# Patient Record
Sex: Male | Born: 1945
Health system: Southern US, Community
[De-identification: ages and names within clinical notes are randomized; demographics above are authoritative.]

## PROBLEM LIST (undated history)

## (undated) DIAGNOSIS — E785 Hyperlipidemia, unspecified: Secondary | ICD-10-CM

## (undated) DIAGNOSIS — E119 Type 2 diabetes mellitus without complications: Secondary | ICD-10-CM

## (undated) DIAGNOSIS — Z952 Presence of prosthetic heart valve: Secondary | ICD-10-CM

## (undated) DIAGNOSIS — I10 Essential (primary) hypertension: Secondary | ICD-10-CM

## (undated) DIAGNOSIS — M109 Gout, unspecified: Secondary | ICD-10-CM

## (undated) HISTORY — DX: Gout, unspecified: M10.9

## (undated) HISTORY — DX: Hyperlipidemia, unspecified: E78.5

## (undated) HISTORY — DX: Type 2 diabetes mellitus without complications: E11.9

## (undated) HISTORY — DX: Essential (primary) hypertension: I10

## (undated) HISTORY — DX: Presence of prosthetic heart valve: Z95.2

---

## 2004-10-14 HISTORY — PX: FOOT SURGERY: SHX648

## 2009-10-14 HISTORY — PX: AORTIC VALVE REPLACEMENT: SHX41

## 2009-10-14 HISTORY — PX: EYE SURGERY: SHX253

## 2016-04-18 DIAGNOSIS — E1165 Type 2 diabetes mellitus with hyperglycemia: Secondary | ICD-10-CM | POA: Diagnosis not present

## 2016-04-18 DIAGNOSIS — Z6823 Body mass index (BMI) 23.0-23.9, adult: Secondary | ICD-10-CM | POA: Diagnosis not present

## 2016-04-18 DIAGNOSIS — Z952 Presence of prosthetic heart valve: Secondary | ICD-10-CM | POA: Diagnosis not present

## 2016-04-18 DIAGNOSIS — M109 Gout, unspecified: Secondary | ICD-10-CM | POA: Diagnosis not present

## 2016-04-18 DIAGNOSIS — I10 Essential (primary) hypertension: Secondary | ICD-10-CM | POA: Diagnosis not present

## 2016-05-06 DIAGNOSIS — E1165 Type 2 diabetes mellitus with hyperglycemia: Secondary | ICD-10-CM | POA: Diagnosis not present

## 2016-05-06 DIAGNOSIS — Z6823 Body mass index (BMI) 23.0-23.9, adult: Secondary | ICD-10-CM | POA: Diagnosis not present

## 2016-05-06 DIAGNOSIS — I10 Essential (primary) hypertension: Secondary | ICD-10-CM | POA: Diagnosis not present

## 2016-05-06 DIAGNOSIS — Z952 Presence of prosthetic heart valve: Secondary | ICD-10-CM | POA: Diagnosis not present

## 2016-05-06 DIAGNOSIS — M109 Gout, unspecified: Secondary | ICD-10-CM | POA: Diagnosis not present

## 2016-05-06 DIAGNOSIS — Z7689 Persons encountering health services in other specified circumstances: Secondary | ICD-10-CM | POA: Diagnosis not present

## 2016-06-24 DIAGNOSIS — I10 Essential (primary) hypertension: Secondary | ICD-10-CM | POA: Diagnosis not present

## 2016-06-24 DIAGNOSIS — M109 Gout, unspecified: Secondary | ICD-10-CM | POA: Diagnosis not present

## 2016-06-24 DIAGNOSIS — E1165 Type 2 diabetes mellitus with hyperglycemia: Secondary | ICD-10-CM | POA: Diagnosis not present

## 2016-06-24 DIAGNOSIS — Z952 Presence of prosthetic heart valve: Secondary | ICD-10-CM | POA: Diagnosis not present

## 2016-09-20 DIAGNOSIS — I10 Essential (primary) hypertension: Secondary | ICD-10-CM | POA: Diagnosis not present

## 2016-09-20 DIAGNOSIS — Z952 Presence of prosthetic heart valve: Secondary | ICD-10-CM | POA: Diagnosis not present

## 2016-09-20 DIAGNOSIS — M109 Gout, unspecified: Secondary | ICD-10-CM | POA: Diagnosis not present

## 2016-09-20 DIAGNOSIS — E1165 Type 2 diabetes mellitus with hyperglycemia: Secondary | ICD-10-CM | POA: Diagnosis not present

## 2016-09-24 DIAGNOSIS — I10 Essential (primary) hypertension: Secondary | ICD-10-CM | POA: Diagnosis not present

## 2016-09-24 DIAGNOSIS — M109 Gout, unspecified: Secondary | ICD-10-CM | POA: Diagnosis not present

## 2016-09-24 DIAGNOSIS — Z952 Presence of prosthetic heart valve: Secondary | ICD-10-CM | POA: Diagnosis not present

## 2016-09-24 DIAGNOSIS — Z23 Encounter for immunization: Secondary | ICD-10-CM | POA: Diagnosis not present

## 2016-09-24 DIAGNOSIS — E1165 Type 2 diabetes mellitus with hyperglycemia: Secondary | ICD-10-CM | POA: Diagnosis not present

## 2016-09-24 DIAGNOSIS — E875 Hyperkalemia: Secondary | ICD-10-CM | POA: Diagnosis not present

## 2017-05-14 DIAGNOSIS — Z1212 Encounter for screening for malignant neoplasm of rectum: Secondary | ICD-10-CM | POA: Diagnosis not present

## 2017-05-14 DIAGNOSIS — E1165 Type 2 diabetes mellitus with hyperglycemia: Secondary | ICD-10-CM | POA: Diagnosis not present

## 2017-05-14 DIAGNOSIS — M109 Gout, unspecified: Secondary | ICD-10-CM | POA: Diagnosis not present

## 2017-05-14 DIAGNOSIS — Z0001 Encounter for general adult medical examination with abnormal findings: Secondary | ICD-10-CM | POA: Diagnosis not present

## 2017-05-14 DIAGNOSIS — Z952 Presence of prosthetic heart valve: Secondary | ICD-10-CM | POA: Diagnosis not present

## 2017-05-14 DIAGNOSIS — I10 Essential (primary) hypertension: Secondary | ICD-10-CM | POA: Diagnosis not present

## 2017-05-15 DIAGNOSIS — E875 Hyperkalemia: Secondary | ICD-10-CM | POA: Diagnosis not present

## 2017-05-22 DIAGNOSIS — E875 Hyperkalemia: Secondary | ICD-10-CM | POA: Diagnosis not present

## 2017-05-30 DIAGNOSIS — E875 Hyperkalemia: Secondary | ICD-10-CM | POA: Diagnosis not present

## 2017-06-02 DIAGNOSIS — E875 Hyperkalemia: Secondary | ICD-10-CM | POA: Diagnosis not present

## 2017-10-22 DIAGNOSIS — E875 Hyperkalemia: Secondary | ICD-10-CM | POA: Diagnosis not present

## 2017-10-22 DIAGNOSIS — Z952 Presence of prosthetic heart valve: Secondary | ICD-10-CM | POA: Diagnosis not present

## 2017-10-22 DIAGNOSIS — I10 Essential (primary) hypertension: Secondary | ICD-10-CM | POA: Diagnosis not present

## 2017-10-22 DIAGNOSIS — E1165 Type 2 diabetes mellitus with hyperglycemia: Secondary | ICD-10-CM | POA: Diagnosis not present

## 2017-10-28 DIAGNOSIS — E1165 Type 2 diabetes mellitus with hyperglycemia: Secondary | ICD-10-CM | POA: Diagnosis not present

## 2017-10-28 DIAGNOSIS — I10 Essential (primary) hypertension: Secondary | ICD-10-CM | POA: Diagnosis not present

## 2017-10-28 DIAGNOSIS — M545 Low back pain: Secondary | ICD-10-CM | POA: Diagnosis not present

## 2017-10-28 DIAGNOSIS — M109 Gout, unspecified: Secondary | ICD-10-CM | POA: Diagnosis not present

## 2017-10-28 DIAGNOSIS — Z23 Encounter for immunization: Secondary | ICD-10-CM | POA: Diagnosis not present

## 2017-10-28 DIAGNOSIS — Z952 Presence of prosthetic heart valve: Secondary | ICD-10-CM | POA: Diagnosis not present

## 2018-10-21 DIAGNOSIS — M545 Low back pain: Secondary | ICD-10-CM | POA: Diagnosis not present

## 2018-10-21 DIAGNOSIS — Z952 Presence of prosthetic heart valve: Secondary | ICD-10-CM | POA: Diagnosis not present

## 2018-10-21 DIAGNOSIS — Z23 Encounter for immunization: Secondary | ICD-10-CM | POA: Diagnosis not present

## 2018-10-21 DIAGNOSIS — M109 Gout, unspecified: Secondary | ICD-10-CM | POA: Diagnosis not present

## 2018-10-21 DIAGNOSIS — R0609 Other forms of dyspnea: Secondary | ICD-10-CM | POA: Diagnosis not present

## 2018-10-21 DIAGNOSIS — E1165 Type 2 diabetes mellitus with hyperglycemia: Secondary | ICD-10-CM | POA: Diagnosis not present

## 2018-10-21 DIAGNOSIS — I10 Essential (primary) hypertension: Secondary | ICD-10-CM | POA: Diagnosis not present

## 2018-11-20 ENCOUNTER — Encounter: Payer: Self-pay | Admitting: *Deleted

## 2018-11-23 ENCOUNTER — Encounter: Payer: Self-pay | Admitting: *Deleted

## 2018-11-23 ENCOUNTER — Telehealth: Payer: Self-pay | Admitting: Cardiovascular Disease

## 2018-11-23 ENCOUNTER — Ambulatory Visit (INDEPENDENT_AMBULATORY_CARE_PROVIDER_SITE_OTHER): Payer: Medicare Other | Admitting: Cardiovascular Disease

## 2018-11-23 ENCOUNTER — Encounter: Payer: Self-pay | Admitting: Cardiovascular Disease

## 2018-11-23 VITALS — BP 211/85 | HR 46 | Ht 66.5 in | Wt 165.8 lb

## 2018-11-23 DIAGNOSIS — I10 Essential (primary) hypertension: Secondary | ICD-10-CM | POA: Diagnosis not present

## 2018-11-23 DIAGNOSIS — Z952 Presence of prosthetic heart valve: Secondary | ICD-10-CM | POA: Diagnosis not present

## 2018-11-23 DIAGNOSIS — N183 Chronic kidney disease, stage 3 unspecified: Secondary | ICD-10-CM

## 2018-11-23 DIAGNOSIS — E1165 Type 2 diabetes mellitus with hyperglycemia: Secondary | ICD-10-CM

## 2018-11-23 DIAGNOSIS — R0609 Other forms of dyspnea: Secondary | ICD-10-CM

## 2018-11-23 DIAGNOSIS — Z951 Presence of aortocoronary bypass graft: Secondary | ICD-10-CM | POA: Diagnosis not present

## 2018-11-23 DIAGNOSIS — R001 Bradycardia, unspecified: Secondary | ICD-10-CM

## 2018-11-23 MED ORDER — METOPROLOL TARTRATE 25 MG PO TABS: 25.0000 mg | ORAL_TABLET | Freq: Two times a day (BID) | ORAL | 6 refills | Status: DC

## 2018-11-23 NOTE — Patient Instructions (Signed)
Medication Instructions:   Decrease Lopressor to 25mg  twice a day.  Continue all other medications.    Labwork:   Testing/Procedures:  Your physician has requested that you have an echocardiogram. Echocardiography is a painless test that uses sound waves to create images of your heart. It provides your doctor with information about the size and shape of your heart and how well your heart's chambers and valves are working. This procedure takes approximately one hour. There are no restrictions for this procedure.  Your physician has requested that you have en exercise stress myoview. For further information please visit [email or web address]. Please follow instruction sheet, as given.  Office will contact with results via phone or letter.    Follow-Up: 3 months   Any Other Special Instructions Will Be Listed Below (If Applicable). Your physician has requested that you regularly monitor and record your blood pressure readings at home. Please take readings 3 x week for 1 months and return log to office for MD review.    If you need a refill on your cardiac medications before your next appointment, please call your pharmacy.

## 2018-11-23 NOTE — Progress Notes (Signed)
CARDIOLOGY CONSULT NOTE  Patient ID: Keith Parrish MRN: [id number] DOB/AGE: 02/24/1946 73 y.o.  Admit date: (Not on file) Primary Physician: Rory Percy, MD Referring Physician: Rory Percy, MD  Reason for Consultation: Dyspnea on exertion  HPI: Keith Parrish is a 73 y.o. male who is being seen today for the evaluation of dyspnea on exertion at the request of Rory Percy, MD.   Past medical history includes hypertension, diabetes, gout, chronic kidney disease stage III, history of bioprosthetic aortic valve replacement, and possibly one-vessel CABG.  I reviewed labs dated 10/21/2018: BUN 40, creatinine 1.63, sodium 141, potassium 5.2, hemoglobin 13, total cholesterol 216, triglycerides 141, HDL 35, LDL 153, A1c 9.1%, TSH 2.13.  ECG performed in the office today which I ordered and personally interpreted demonstrates marked sinus bradycardia with left anterior fascicular block and nonspecific T wave abnormalities.  He and his wife are here today.  They moved from [place] initially to [place] and then to [place] about 3 years ago.  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  His wife has several relatives who used to teach skiing at [employer] in [place].  He underwent bioprosthetic Edwards aortic valve replacement on 12/28/2009 at [facility].  He may have also undergone one-vessel CABG but that detail is unclear to me.  Since the summer 2019 he has been experiencing progressive exertional dyspnea.  He is now short of breath when taking the trash cans out to the end of the driveway.  His wife said his breathing was labored last night.  He denies exertional chest pain.  He denies orthopnea, palpitations, and leg swelling.  He checks his blood pressure at home and it is usually well controlled.  It is markedly elevated today, 211/85, and his wife said he is very nervous today.    Soc Hx: They moved from [place] initially to  [place] and then to [place] about 3 years ago.  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  His wife has several relatives who used to teach skiing at [employer] in [place].   Allergies  Allergen Reactions  . Bee Venom Shortness Of Breath and Swelling  . Ace Inhibitors Other (See Comments)    Elevated potassium    Current Outpatient Medications  Medication Sig Dispense Refill  . allopurinol (ZYLOPRIM) 100 MG tablet Take 100 mg by mouth daily.     . APPLE CIDER VINEGAR PO Take 15 mLs by mouth daily.    . Insulin Glargine (LANTUS SOLOSTAR) 100 UNIT/ML Solostar Pen Inject 50 Units into the skin daily.    . metoprolol tartrate (LOPRESSOR) 50 MG tablet Take 50 mg by mouth 2 (two) times daily.     No current facility-administered medications for this visit.     Past Medical History:  Diagnosis Date  . Diabetes ([place])   . Gout   . Hyperlipidemia   . Hypertension   . Presence of prosthetic heart valve     Past Surgical History:  Procedure Laterality Date  . AORTIC VALVE REPLACEMENT  2011   pig valve  . EYE SURGERY Bilateral 2011   cataract removed from right and left eye  . FOOT SURGERY Left 2006   gout cyst removed    Social History   Socioeconomic History  . Marital status: Married    Spouse name: Not on file  . Number of children: Not on file  . Years of education: Not on  file  . Highest education level: Not on file  Occupational History  . Not on file  Social Needs  . Financial resource strain: Not on file  . Food insecurity:    Worry: Not on file    Inability: Not on file  . Transportation needs:    Medical: Not on file    Non-medical: Not on file  Tobacco Use  . Smoking status: Former Smoker    Types: Pipe  . Smokeless tobacco: Never Used  Substance and Sexual Activity  . Alcohol use: Not on file  . Drug use: Not on file  . Sexual activity: Not on file  Lifestyle  . Physical activity:    Days per week: Not on file    Minutes per  session: Not on file  . Stress: Not on file  Relationships  . Social connections:    Talks on phone: Not on file    Gets together: Not on file    Attends religious service: Not on file    Active member of club or organization: Not on file    Attends meetings of clubs or organizations: Not on file    Relationship status: Not on file  . Intimate partner violence:    Fear of current or ex partner: Not on file    Emotionally abused: Not on file    Physically abused: Not on file    Forced sexual activity: Not on file  Other Topics Concern  . Not on file  Social History Narrative  . Not on file     No family history of premature CAD in 1st degree relatives.  Current Meds  Medication Sig  . allopurinol (ZYLOPRIM) 100 MG tablet Take 100 mg by mouth daily.   . APPLE CIDER VINEGAR PO Take 15 mLs by mouth daily.  . Insulin Glargine (LANTUS SOLOSTAR) 100 UNIT/ML Solostar Pen Inject 50 Units into the skin daily.  . metoprolol tartrate (LOPRESSOR) 50 MG tablet Take 50 mg by mouth 2 (two) times daily.      Review of systems complete and found to be negative unless listed above in HPI    Physical exam Blood pressure (!) 211/85, pulse (!) 46, height 5' 6.5" (1.689 m), weight 165 lb 12.8 oz (75.2 kg), SpO2 97 %. General: NAD Neck: No JVD, no thyromegaly or thyroid nodule.  Lungs: Clear to auscultation bilaterally with normal respiratory effort. CV: Nondisplaced PMI.  Bradycardic, regular rhythm, normal S1/S2, no S3/S4, no murmur.  No peripheral edema.  No carotid bruit.    Abdomen: Soft, nontender, no distention.  Skin: Intact without lesions or rashes.  Neurologic: Alert and oriented x 3.  Psych: Normal affect. Extremities: No clubbing or cyanosis.  HEENT: Normal.   ECG: Most recent ECG reviewed.   Labs: No results found for: K, BUN, CREATININE, ALT, TSH, HGB   Lipids: No results found for: LDLCALC, LDLDIRECT, CHOL, TRIG, HDL      ASSESSMENT AND PLAN:  1.  Dyspnea on  exertion: Multiple cardiovascular risk factors as detailed above.  He is also markedly bradycardic and this may also be contributing to his symptoms.  I will reduce metoprolol to 25 mg twice daily.  I will obtain an exercise Myoview to evaluate for ischemia.  He may have a history of one-vessel CABG.  I will obtain cardiac catheterization and operative reports from [facility].  2.  Accelerated hypertension: He is markedly bradycardic so I will reduce metoprolol to 25 mg twice daily.  I will have him  check his blood pressure and heart rate at home 3 times per week for the next month to see if further medication adjustments are required.  3.  Chronic kidney disease stage III: Labs reviewed above.  He needs aggressive control of blood pressure and diabetes.  4.  Type 2 diabetes mellitus: Currently on Lantus.  He meets criteria for statin therapy.  He also may have a history of one-vessel CABG.  I will address this at his next visit.  A1c greater than 9%.  5.  Bradycardia: I will use metoprolol to 25 mg twice daily.  6.  Status post bioprosthetic aortic valve replacement: No murmurs appreciated on exam. I will order a 2-D echocardiogram with Doppler to evaluate cardiac structure, function, and regional wall motion.   Disposition: Follow up in 3 months   Signed: Kate Sable, M.D., F.A.C.C.  11/23/2018, 9:41 AM

## 2018-11-23 NOTE — Telephone Encounter (Signed)
°  Precert needed for: Echo - h/o AVR   Location: [facility]     Date:  Dec 09, 2018

## 2018-11-23 NOTE — Telephone Encounter (Signed)
°  Precert needed for:    *Exercise MV - doe    Location: [facility]    Date: Dec 11, 2018

## 2018-11-25 ENCOUNTER — Other Ambulatory Visit: Payer: Self-pay | Admitting: Cardiovascular Disease

## 2018-11-25 DIAGNOSIS — I359 Nonrheumatic aortic valve disorder, unspecified: Secondary | ICD-10-CM

## 2018-12-09 ENCOUNTER — Ambulatory Visit (INDEPENDENT_AMBULATORY_CARE_PROVIDER_SITE_OTHER): Payer: Medicare Other

## 2018-12-09 ENCOUNTER — Telehealth: Payer: Self-pay | Admitting: Cardiovascular Disease

## 2018-12-09 DIAGNOSIS — I359 Nonrheumatic aortic valve disorder, unspecified: Secondary | ICD-10-CM

## 2018-12-09 NOTE — Telephone Encounter (Signed)
Can you call and ask him why? His symptoms were concerning.

## 2018-12-09 NOTE — Telephone Encounter (Signed)
Noted and will forward to nurse for f/u

## 2018-12-09 NOTE — Telephone Encounter (Signed)
Patient does not want to due Stress test and has cancelled them.

## 2018-12-09 NOTE — Telephone Encounter (Signed)
Wireless customer not available

## 2018-12-09 NOTE — Telephone Encounter (Signed)
Forward to provider for notification.  Do you want to keep his f/u in 3 months ??

## 2018-12-11 ENCOUNTER — Encounter (HOSPITAL_COMMUNITY): Payer: Medicare Other

## 2018-12-11 ENCOUNTER — Ambulatory Visit (HOSPITAL_COMMUNITY): Payer: Medicare Other

## 2018-12-11 NOTE — Telephone Encounter (Signed)
Notes recorded by Laurine Blazer, LPN on 05/12/8568 at 4:37 PM EST Patient notified. Copy to pmd. Follow up scheduled for 03/09/19 with Dr. Paralee Cancel office.   ------  Notes recorded by Laurine Blazer, LPN on 0/02/2590 at 0:28 PM EST Wireless customer not available. ------  Notes recorded by Herminio Commons, MD on 12/09/2018 at 12:58 PM EST Pumping function of main cardiac chamber (left ventricle) was normal. There is severe thickening of the muscular walls. The upper chambers of the heart are dilated. The artificial aortic valve appears to be functioning well. There is some mild to moderate mitral valve leakage. There is moderate tricuspid valve leakage. Pulmonary pressures are severely elevated. He reportedly canceled his stress test. He may need right and left heart catheterization for a more comprehensive evaluation.

## 2018-12-11 NOTE — Telephone Encounter (Signed)
Patient cancelled the stress due to fear of something happening during stress test.  Had other family members that has had chemical stress test and had problems during the test.  I explained to him the difference between the Exercise Myoview vs the [facility].  Patient stated that is willing to do the test now that he has better understanding of how they are done.  States he will do whichever test that his doctor feels he needs.

## 2018-12-14 NOTE — Telephone Encounter (Signed)
Actually think the best strategy is to pursue right and left heart catheterization and coronary angiography given the markedly elevated pressures in his lungs based on echocardiogram.  I have to determine if his symptoms are due to blockages in his arteries, significantly elevated pressures in his lungs, or both.  He can be scheduled to see an APP in near future for further discussion of this or we can just go ahead and schedule this.

## 2018-12-16 NOTE — Telephone Encounter (Signed)
Left message to return call on Friday as I will be out of the office tomorrow.

## 2018-12-18 NOTE — Telephone Encounter (Signed)
Left message to return call 

## 2018-12-23 NOTE — Telephone Encounter (Signed)
Left message to return call.  (3rd attempt)

## 2018-12-29 ENCOUNTER — Encounter: Payer: Self-pay | Admitting: *Deleted

## 2018-12-29 NOTE — Telephone Encounter (Signed)
No recall to present date.  Will mail letter.

## 2019-03-03 ENCOUNTER — Telehealth: Payer: Self-pay | Admitting: *Deleted

## 2019-03-03 NOTE — Telephone Encounter (Signed)
Called to offer VV but patient declined and denied any worsening symptoms of chest pain, dizziness, sob, palpitations, weight gain or swelling. Appointment rescheduled per patient request.

## 2019-03-09 ENCOUNTER — Ambulatory Visit: Payer: Medicare Other | Admitting: Cardiovascular Disease

## 2019-05-25 DIAGNOSIS — E1165 Type 2 diabetes mellitus with hyperglycemia: Secondary | ICD-10-CM | POA: Diagnosis not present

## 2019-05-25 DIAGNOSIS — E162 Hypoglycemia, unspecified: Secondary | ICD-10-CM | POA: Diagnosis not present

## 2019-05-25 DIAGNOSIS — E161 Other hypoglycemia: Secondary | ICD-10-CM | POA: Diagnosis not present

## 2019-05-25 DIAGNOSIS — Z952 Presence of prosthetic heart valve: Secondary | ICD-10-CM | POA: Diagnosis not present

## 2019-05-25 DIAGNOSIS — R410 Disorientation, unspecified: Secondary | ICD-10-CM | POA: Diagnosis not present

## 2019-05-25 DIAGNOSIS — R4020 Unspecified coma: Secondary | ICD-10-CM | POA: Diagnosis not present

## 2019-05-25 DIAGNOSIS — R404 Transient alteration of awareness: Secondary | ICD-10-CM | POA: Diagnosis not present

## 2019-05-25 DIAGNOSIS — R0600 Dyspnea, unspecified: Secondary | ICD-10-CM | POA: Diagnosis not present

## 2019-05-25 DIAGNOSIS — R339 Retention of urine, unspecified: Secondary | ICD-10-CM | POA: Diagnosis not present

## 2019-05-25 DIAGNOSIS — R609 Edema, unspecified: Secondary | ICD-10-CM | POA: Diagnosis not present

## 2019-06-01 ENCOUNTER — Telehealth: Payer: Self-pay | Admitting: Cardiovascular Disease

## 2019-06-01 NOTE — Telephone Encounter (Signed)

## 2019-06-03 ENCOUNTER — Other Ambulatory Visit: Payer: Self-pay

## 2019-06-03 ENCOUNTER — Encounter: Payer: Self-pay | Admitting: Cardiovascular Disease

## 2019-06-03 ENCOUNTER — Ambulatory Visit: Payer: Medicare Other | Admitting: Cardiovascular Disease

## 2019-06-03 ENCOUNTER — Encounter: Payer: Self-pay | Admitting: *Deleted

## 2019-06-03 VITALS — BP 215/89 | HR 72 | Ht 66.5 in | Wt 173.6 lb

## 2019-06-03 DIAGNOSIS — Z951 Presence of aortocoronary bypass graft: Secondary | ICD-10-CM

## 2019-06-03 DIAGNOSIS — I10 Essential (primary) hypertension: Secondary | ICD-10-CM | POA: Diagnosis not present

## 2019-06-03 DIAGNOSIS — N183 Chronic kidney disease, stage 3 unspecified: Secondary | ICD-10-CM

## 2019-06-03 DIAGNOSIS — I160 Hypertensive urgency: Secondary | ICD-10-CM

## 2019-06-03 DIAGNOSIS — Z952 Presence of prosthetic heart valve: Secondary | ICD-10-CM

## 2019-06-03 DIAGNOSIS — R0609 Other forms of dyspnea: Secondary | ICD-10-CM

## 2019-06-03 DIAGNOSIS — E1165 Type 2 diabetes mellitus with hyperglycemia: Secondary | ICD-10-CM

## 2019-06-03 DIAGNOSIS — I2721 Secondary pulmonary arterial hypertension: Secondary | ICD-10-CM | POA: Diagnosis not present

## 2019-06-03 DIAGNOSIS — I5033 Acute on chronic diastolic (congestive) heart failure: Secondary | ICD-10-CM

## 2019-06-03 MED ORDER — CARVEDILOL 3.125 MG PO TABS: 3.1250 mg | ORAL_TABLET | Freq: Two times a day (BID) | ORAL | 1 refills | Status: DC

## 2019-06-03 MED ORDER — FUROSEMIDE 40 MG PO TABS: ORAL_TABLET | ORAL | 1 refills | Status: DC

## 2019-06-03 MED ORDER — AMLODIPINE BESYLATE 5 MG PO TABS: 5.0000 mg | ORAL_TABLET | Freq: Every day | ORAL | 1 refills | Status: DC

## 2019-06-03 NOTE — Patient Instructions (Signed)
Your physician recommends that you schedule a follow-up appointment in: [place] has recommended you make the following change in your medication:   STOP LOPRESSOR   START COREG 3.125 MG TWICE DAILY   START LASIX 40 MG TWICE DAILY FOR 5 DAYS THEN TAKE 1 TABLET DAILY   Your physician recommends that you return for lab work ON Saturday BMP AND BMP ON NEXT Friday 06/11/2019 - WE HAVE GIVEN YOU ORDERS   PLEASE CALL us ON Monday WITH AN UPDATE ON YOUR SYMPTOMS   Thank you for choosing [facility]!!

## 2019-06-03 NOTE — Progress Notes (Signed)
SUBJECTIVE: The patient presents for follow-up after initially evaluated him on 11/23/2018.  At that time he was complaining of exertional dyspnea.  I ordered a stress test which the patient canceled.  I also ordered an echocardiogram.  Echocardiogram on 12/09/2018 demonstrated normal LV systolic function, EF 55 to 60%, severe LVH, diastolic dysfunction, mild right ventricular enlargement, severe left atrial and moderate right atrial dilatation, trivial pericardial effusion, normally functioning bioprosthetic aortic valve, mild to moderate mitral regurgitation, moderate tricuspid regurgitation, and severe pulmonary hypertension.  Past medical history includes hypertension, diabetes, gout, chronic kidney disease stage III, history of bioprosthetic aortic valve replacement, and possibly one-vessel CABG.  I did not receive records from [facility].  He underwent bioprosthetic Edwards aortic valve replacement on 12/28/2009 at [facility].  He may have also undergone one-vessel CABG but that detail is unclear to me.  He is here with his wife.  Over the past 3 weeks he has developed progressive swelling from his waist down.  He complains of scrotal and bilateral leg swelling.  His baseline weight is between 155 and 160 pounds and he weighs 173 pounds in our office today.  He said his blood pressure is markedly elevated today because he is very nervous.  It is 215/89.  He denies chest pain.  Chronic exertional dyspnea is stable since I last saw him.  His blood sugar dropped to 38 about 1 or 2 weeks ago and EMS was called.  They gave him dextrose and he improved and he was not taken to the hospital.  His PCP recently prescribed Lasix 20 mg daily for 7 days.  He said he urinated more for the first 24 hours but after that it subsided.    Soc Hx: They moved from [place] initially to [place] and then to [place] about 3 years ago.  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  His wife has several relatives who used to teach skiing at [employer] in [place].  Review of Systems: As per "subjective", otherwise negative.  Allergies  Allergen Reactions   Bee Venom Shortness Of Breath and Swelling   Ace Inhibitors Other (See Comments)    Elevated potassium    Current Outpatient Medications  Medication Sig Dispense Refill   allopurinol (ZYLOPRIM) 100 MG tablet Take 100 mg by mouth daily.      APPLE CIDER VINEGAR PO Take 15 mLs by mouth daily.     Insulin Glargine (LANTUS SOLOSTAR) 100 UNIT/ML Solostar Pen Inject 50 Units into the skin daily.     metoprolol tartrate (LOPRESSOR) 25 MG tablet Take 1 tablet (25 mg total) by mouth 2 (two) times daily. 60 tablet 6   No current facility-administered medications for this visit.     Past Medical History:  Diagnosis Date   Diabetes ([place])    Gout    Hyperlipidemia    Hypertension    Presence of prosthetic heart valve     Past Surgical History:  Procedure Laterality Date   AORTIC VALVE REPLACEMENT  2011   pig valve   EYE SURGERY Bilateral 2011   cataract removed from right and left eye   FOOT SURGERY Left 2006   gout cyst removed    Social History   Socioeconomic History   Marital status: Married    Spouse name: Not on file   Number of children: Not on file   Years of education: Not on file   Highest education level: Not on  file  Occupational History   Not on file  Social Needs   Financial resource strain: Not on file   Food insecurity    Worry: Not on file    Inability: Not on file   Transportation needs    Medical: Not on file    Non-medical: Not on file  Tobacco Use   Smoking status: Former Smoker    Types: Pipe   Smokeless tobacco: Never Used  Substance and Sexual Activity   Alcohol use: Not on file   Drug use: Not on file   Sexual activity: Not on file  Lifestyle   Physical activity    Days per week: Not on file    Minutes per session:  Not on file   Stress: Not on file  Relationships   Social connections    Talks on phone: Not on file    Gets together: Not on file    Attends religious service: Not on file    Active member of club or organization: Not on file    Attends meetings of clubs or organizations: Not on file    Relationship status: Not on file   Intimate partner violence    Fear of current or ex partner: Not on file    Emotionally abused: Not on file    Physically abused: Not on file    Forced sexual activity: Not on file  Other Topics Concern   Not on file  Social History Narrative   Not on file     Vitals:   06/03/19 1057 06/03/19 1100  BP: (!) 200/111 (!) 215/89  Pulse: 72   SpO2: 98%   Weight: 173 lb 9.6 oz (78.7 kg)   Height: 5' 6.5" (1.689 m)     Wt Readings from Last 3 Encounters:  06/03/19 173 lb 9.6 oz (78.7 kg)  11/23/18 165 lb 12.8 oz (75.2 kg)     PHYSICAL EXAM General: NAD HEENT: Normal. Neck: +JVD. Lungs: Clear to auscultation bilaterally with normal respiratory effort. CV: Regular rate and rhythm, normal S1/S2, no S3/S4, no murmur.  Tense bilateral leg edema.  No carotid bruit.   Abdomen: Soft, nontender, no distention.  Neurologic: Alert and oriented.  Psych: Normal affect. Skin: Normal. Musculoskeletal: No gross deformities.    ECG: Reviewed above under Subjective   Labs: No results found for: K, BUN, CREATININE, ALT, TSH, HGB   Lipids: No results found for: LDLCALC, LDLDIRECT, CHOL, TRIG, HDL     ASSESSMENT AND PLAN: 1.  Dyspnea on exertion/severe pulmonary hypertension/acute on chronic diastolic heart failure: He is at least 18 if not 23 pounds above his baseline weight.  I will start Lasix 40 mg twice daily for 5 days and then reduce to 40 mg daily.  I will check a basic metabolic panel in 2 days and then 1 week later.  I will have the results called to the cardiologist on-call at [facility].  I talked to him about hospitalization with IV diuretics and  daily monitoring of renal function but he declined.  I will switch Lopressor to carvedilol 3.125 mg twice daily.  I will add amlodipine 5 mg daily.  I will hold off on providing potassium supplementation until I see the results of his base metabolic panel given his chronic kidney disease.  I will have my nurse call him on Monday to see how he is doing. I suspect severe pulmonary hypertension is due to severely elevated blood pressures and consequent diastolic dysfunction.  He canceled the stress  test.   2.    Hypertensive urgency: Blood pressure is severely elevated.  I reviewed his blood pressure log and systolic readings are generally in the 170 range on average.  He has hypertensive heart disease with severe LVH.  I will start amlodipine 5 mg daily and switch Lopressor to carvedilol 3.125 mg twice daily.  3.  Chronic kidney disease stage III:  He needs aggressive control of blood pressure and diabetes.  Given diuretic adjustments as noted above, I will check a basic metabolic panel in 2 days and 1 week later. I will have the results called to the cardiologist on-call at [facility].   4.  Type 2 diabetes mellitus: Currently on Lantus.  He meets criteria for statin therapy.  He also may have a history of one-vessel CABG.  A1c greater than 9%.  5.  Status post bioprosthetic aortic valve replacement: Normally functioning bioprosthesis by echocardiogram earlier this year.    Disposition: Follow up 3 weeks  A high level of decision making was required for increased medical complexities.    Kate Sable, M.D., F.A.C.C.

## 2019-06-05 ENCOUNTER — Other Ambulatory Visit (HOSPITAL_COMMUNITY)
Admission: RE | Admit: 2019-06-05 | Discharge: 2019-06-05 | Disposition: A | Discharge status: Home / Self Care | Payer: Medicare Other | Source: Ambulatory Visit | Attending: Cardiovascular Disease | Admitting: Cardiovascular Disease

## 2019-06-05 DIAGNOSIS — R0609 Other forms of dyspnea: Secondary | ICD-10-CM | POA: Insufficient documentation

## 2019-06-05 LAB — BASIC METABOLIC PANEL
Anion gap: 8 (ref 5–15)
BUN: 35 mg/dL — ABNORMAL HIGH (ref 8–23)
CO2: 26 mmol/L (ref 22–32)
Calcium: 8.3 mg/dL — ABNORMAL LOW (ref 8.9–10.3)
Chloride: 106 mmol/L (ref 98–111)
Creatinine, Ser: 1.76 mg/dL — ABNORMAL HIGH (ref 0.61–1.24)
GFR calc Af Amer: 44 mL/min — ABNORMAL LOW (ref >60)
GFR calc non Af Amer: 38 mL/min — ABNORMAL LOW (ref >60)
Glucose, Bld: 181 mg/dL — ABNORMAL HIGH (ref 70–99)
Potassium: 4.8 mmol/L (ref 3.5–5.1)
Sodium: 140 mmol/L (ref 135–145)

## 2019-06-08 ENCOUNTER — Telehealth: Payer: Self-pay

## 2019-06-08 NOTE — Telephone Encounter (Signed)
Called pt. No answer. Left detailed message. Copy to pcp.

## 2019-06-10 ENCOUNTER — Telehealth: Payer: Self-pay | Admitting: Cardiovascular Disease

## 2019-06-10 NOTE — Telephone Encounter (Signed)
This is an [place] pt. She was confused about all of her appointments and lab orders. I let her know I will messge  [facility] schedulers to call her if they have any cancellations with Dr. Bronson Ing. Pt voiced understanding.

## 2019-06-10 NOTE — Telephone Encounter (Signed)
Patient called in regards to recent lab test results. Patient was under the impression she has an appointment on Monday 06-14-2019

## 2019-06-11 DIAGNOSIS — I10 Essential (primary) hypertension: Secondary | ICD-10-CM | POA: Diagnosis not present

## 2019-06-25 ENCOUNTER — Other Ambulatory Visit: Payer: Self-pay

## 2019-06-25 ENCOUNTER — Encounter: Payer: Self-pay | Admitting: Cardiovascular Disease

## 2019-06-25 ENCOUNTER — Ambulatory Visit: Payer: Medicare Other | Admitting: Cardiovascular Disease

## 2019-06-25 VITALS — BP 195/92 | HR 89 | Temp 98.0°F | Ht 67.0 in | Wt 141.0 lb

## 2019-06-25 DIAGNOSIS — I2721 Secondary pulmonary arterial hypertension: Secondary | ICD-10-CM

## 2019-06-25 DIAGNOSIS — E1165 Type 2 diabetes mellitus with hyperglycemia: Secondary | ICD-10-CM

## 2019-06-25 DIAGNOSIS — N183 Chronic kidney disease, stage 3 unspecified: Secondary | ICD-10-CM

## 2019-06-25 DIAGNOSIS — Z952 Presence of prosthetic heart valve: Secondary | ICD-10-CM

## 2019-06-25 DIAGNOSIS — Z951 Presence of aortocoronary bypass graft: Secondary | ICD-10-CM

## 2019-06-25 DIAGNOSIS — I10 Essential (primary) hypertension: Secondary | ICD-10-CM

## 2019-06-25 DIAGNOSIS — I5032 Chronic diastolic (congestive) heart failure: Secondary | ICD-10-CM | POA: Diagnosis not present

## 2019-06-25 MED ORDER — CARVEDILOL 6.25 MG PO TABS: 6.2500 mg | ORAL_TABLET | Freq: Two times a day (BID) | ORAL | 1 refills | Status: DC

## 2019-06-25 MED ORDER — FUROSEMIDE 20 MG PO TABS: 20.0000 mg | ORAL_TABLET | Freq: Every day | ORAL | 3 refills | Status: DC

## 2019-06-25 NOTE — Progress Notes (Signed)
SUBJECTIVE: The patient presents for follow-up of acute on chronic diastolic heart failure.  I last saw him on 06/03/2019.  Past medical history includes hypertension, diabetes, gout, chronic kidney disease stage III, history of bioprosthetic aortic valve replacement, and possibly one-vessel CABG.  I did not receive records from [facility].  Echocardiogram on 12/09/2018 demonstrated normal LV systolic function, EF 55 to 60%, severe LVH, diastolic dysfunction, mild right ventricular enlargement, severe left atrial and moderate right atrial dilatation, trivial pericardial effusion, normally functioning bioprosthetic aortic valve, mild to moderate mitral regurgitation, moderate tricuspid regurgitation, and severe pulmonary hypertension.  He underwent bioprosthetic Edwards aortic valve replacement on 12/28/2009 at [facility] (Edwards 25 mm 3300 TFX, Serial # [id number]). He may have also undergone one-vessel CABG but that detail is unclear to me.  He is doing much better and denies chest pain, palpitations, leg swelling, orthopnea, and shortness of breath.  He urinates about 10 times per day.  I reviewed his blood pressure log which demonstrated readings ranging from 110/60-160/80.  He does not own a scale.   Soc Hx:They moved from [place] initially to [place] and then to [place] about 3 years ago. 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 His wife has several relatives who used to teach skiing at [employer] in [place].  He has several family members who are physicians.  Review of Systems: As per "subjective", otherwise negative.  Allergies  Allergen Reactions  . Bee Venom Shortness Of Breath and Swelling  . Ace Inhibitors Other (See Comments)    Elevated potassium    Current Outpatient Medications  Medication Sig Dispense Refill  . allopurinol (ZYLOPRIM) 100 MG tablet Take 100 mg by mouth daily.     Marland Kitchen amLODipine (NORVASC) 5 MG tablet  Take 1 tablet (5 mg total) by mouth daily. 90 tablet 1  . APPLE CIDER VINEGAR PO Take 15 mLs by mouth daily.    . carvedilol (COREG) 3.125 MG tablet Take 1 tablet (3.125 mg total) by mouth 2 (two) times daily with a meal. 180 tablet 1  . furosemide (LASIX) 40 MG tablet TAKE 1 TABLET TWICE DAILY FOR 5 DAYS THEN TAKE 1 TABLET DAILY 180 tablet 1  . Insulin Glargine (LANTUS SOLOSTAR) 100 UNIT/ML Solostar Pen Inject 50 Units into the skin daily.     No current facility-administered medications for this visit.     Past Medical History:  Diagnosis Date  . Diabetes ([place])   . Gout   . Hyperlipidemia   . Hypertension   . Presence of prosthetic heart valve     Past Surgical History:  Procedure Laterality Date  . AORTIC VALVE REPLACEMENT  2011   pig valve  . EYE SURGERY Bilateral 2011   cataract removed from right and left eye  . FOOT SURGERY Left 2006   gout cyst removed    Social History   Socioeconomic History  . Marital status: Married    Spouse name: Not on file  . Number of children: Not on file  . Years of education: Not on file  . Highest education level: Not on file  Occupational History  . Not on file  Social Needs  . Financial resource strain: Not on file  . Food insecurity    Worry: Not on file    Inability: Not on file  . Transportation needs    Medical: Not on file    Non-medical: Not on file  Tobacco Use  . Smoking  status: Former Smoker    Types: Pipe  . Smokeless tobacco: Never Used  Substance and Sexual Activity  . Alcohol use: Not on file    Comment: OCCASIONAL  . Drug use: Never  . Sexual activity: Not on file  Lifestyle  . Physical activity    Days per week: Not on file    Minutes per session: Not on file  . Stress: Not on file  Relationships  . Social Herbalist on phone: Not on file    Gets together: Not on file    Attends religious service: Not on file    Active member of club or organization: Not on file    Attends meetings of  clubs or organizations: Not on file    Relationship status: Not on file  . Intimate partner violence    Fear of current or ex partner: Not on file    Emotionally abused: Not on file    Physically abused: Not on file    Forced sexual activity: Not on file  Other Topics Concern  . Not on file  Social History Narrative  . Not on file     Vitals:   06/25/19 1502  BP: (!) 195/92  Pulse: 89  Temp: 98 F (36.7 C)  SpO2: 94%  Weight: 141 lb (64 kg)  Height: 5\' 7"  (1.702 m)    Wt Readings from Last 3 Encounters:  06/25/19 141 lb (64 kg)  06/03/19 173 lb 9.6 oz (78.7 kg)  11/23/18 165 lb 12.8 oz (75.2 kg)     PHYSICAL EXAM General: NAD HEENT: Normal. Neck: No JVD, no thyromegaly. Lungs: Clear to auscultation bilaterally with normal respiratory effort. CV: Regular rate and rhythm, normal S1/S2, no S3/S4, no murmur. No pretibial or periankle edema.  No carotid bruit.   Abdomen: Soft, nontender, no distention.  Neurologic: Alert and oriented.  Psych: Normal affect. Skin: Normal. Musculoskeletal: No gross deformities.      Labs: Lab Results  Component Value Date/Time   K 4.8 06/05/2019 09:14 AM   BUN 35 (H) 06/05/2019 09:14 AM   CREATININE 1.76 (H) 06/05/2019 09:14 AM     Lipids: No results found for: LDLCALC, LDLDIRECT, CHOL, TRIG, HDL     ASSESSMENT AND PLAN: 1. Severe pulmonary hypertension/Chronic diastolic heart failure:  Weight is down 32 pounds from 06/03/2019.  He is still markedly hypertensive. I will increase carvedilol to 6.25 mg twice daily. I suspect severe pulmonary hypertension is due to severely elevated blood pressures and consequent diastolic dysfunction.  He canceled the stress test.  I will reduce Lasix from 40 to 20 mg daily.  I informed him to weigh himself daily and should weight increase between 3-5 pounds in 24 hours, to take an extra dose of Lasix.  2.   Hypertensive urgency: Blood pressure is severely elevated. I will increase  carvedilol to 6.25 mg twice daily.  3. Chronic kidney disease stage III: He needs aggressive control of blood pressure and diabetes.    BUN 44, creatinine 2.07 on 06/11/2019.  He needs follow-up with nephrology.  I will make a referral.  I will reduce Lasix to 20 mg daily and repeat a basic metabolic panel in 2 weeks.  4. Type 2 diabetes mellitus: Currently on Lantus. He meets criteria for statin therapy. He also may have a history of one-vessel CABG. A1c greater than 9%.  5. Status post bioprosthetic aortic valve replacement: He underwent bioprosthetic Edwards aortic valve replacement on 12/28/2009 at [facility] (Edwards 25 mm 3300 TFX, Serial # [id number]). Normally functioning bioprosthesis by echocardiogram earlier this year.    Disposition: Follow up 3 months   Kate Sable, M.D., F.A.C.C.

## 2019-06-25 NOTE — Patient Instructions (Signed)
Medication Instructions: INCREASE Coreg to 6.25 mg twice a day  DECREASE Lasix to 20 mg daily    Labwork: BMET in 2 weeks  ( 07/09/19)  Procedures/Testing: None today  Follow-Up: 3 months with Dr.Koneswaran  Any Additional Special Instructions Will Be Listed Below (If Applicable).  You have been referred to [facility] [place] .They will call you for apt. [phone]   [place], [id number]    If you need a refill on your cardiac medications before your next appointment, please call your pharmacy.

## 2019-07-06 ENCOUNTER — Ambulatory Visit: Payer: Medicare Other | Admitting: Physician Assistant

## 2019-07-08 DIAGNOSIS — Z23 Encounter for immunization: Secondary | ICD-10-CM | POA: Diagnosis not present

## 2019-07-09 ENCOUNTER — Other Ambulatory Visit (HOSPITAL_COMMUNITY)
Admission: RE | Admit: 2019-07-09 | Discharge: 2019-07-09 | Disposition: A | Discharge status: Home / Self Care | Payer: Medicare Other | Source: Ambulatory Visit | Attending: Cardiovascular Disease | Admitting: Cardiovascular Disease

## 2019-07-09 DIAGNOSIS — N183 Chronic kidney disease, stage 3 (moderate): Secondary | ICD-10-CM | POA: Diagnosis not present

## 2019-07-09 LAB — BASIC METABOLIC PANEL
Anion gap: 8 (ref 5–15)
BUN: 57 mg/dL — ABNORMAL HIGH (ref 8–23)
CO2: 24 mmol/L (ref 22–32)
Calcium: 8.4 mg/dL — ABNORMAL LOW (ref 8.9–10.3)
Chloride: 107 mmol/L (ref 98–111)
Creatinine, Ser: 2.28 mg/dL — ABNORMAL HIGH (ref 0.61–1.24)
GFR calc Af Amer: 32 mL/min — ABNORMAL LOW (ref >60)
GFR calc non Af Amer: 28 mL/min — ABNORMAL LOW (ref >60)
Glucose, Bld: 191 mg/dL — ABNORMAL HIGH (ref 70–99)
Potassium: 5 mmol/L (ref 3.5–5.1)
Sodium: 139 mmol/L (ref 135–145)

## 2019-07-12 ENCOUNTER — Telehealth: Payer: Self-pay

## 2019-07-12 NOTE — Telephone Encounter (Signed)
-----   Message from Massie Maroon, [place] sent at 07/09/2019  4:31 PM EDT -----  ----- Message ----- From: Herminio Commons, MD Sent: 07/09/2019  12:37 PM EDT To: Massie Maroon, CMA  Creatinine and BUN have actually gone up after dose reduction of Lasix to 20 mg daily. Did he reduce the dose? If so, I would have him reduce frequency to Lasix 20 mg every other day and repeat BMET in 2 weeks.

## 2019-07-12 NOTE — Telephone Encounter (Signed)
Called pt. No answer. Left message for pt to return call.  

## 2019-07-13 ENCOUNTER — Telehealth: Payer: Self-pay

## 2019-07-13 DIAGNOSIS — Z79899 Other long term (current) drug therapy: Secondary | ICD-10-CM

## 2019-07-13 MED ORDER — FUROSEMIDE 20 MG PO TABS: 20.0000 mg | ORAL_TABLET | ORAL | 3 refills | Status: DC

## 2019-07-13 NOTE — Telephone Encounter (Signed)
-----   Message from Massie Maroon, [place] sent at 07/09/2019  4:31 PM EDT -----  ----- Message ----- From: Herminio Commons, MD Sent: 07/09/2019  12:37 PM EDT To: Massie Maroon, CMA  Creatinine and BUN have actually gone up after dose reduction of Lasix to 20 mg daily. Did he reduce the dose? If so, I would have him reduce frequency to Lasix 20 mg every other day and repeat BMET in 2 weeks.

## 2019-07-13 NOTE — Telephone Encounter (Signed)
I spoke with wife, she indicated that patient did reduce dose to 20 mg daily.She will further reduce to every other day dosing.I mailed lab slip to her.

## 2019-07-27 ENCOUNTER — Other Ambulatory Visit (HOSPITAL_COMMUNITY)
Admission: RE | Admit: 2019-07-27 | Discharge: 2019-07-27 | Disposition: A | Discharge status: Home / Self Care | Payer: Medicare Other | Source: Ambulatory Visit | Attending: Cardiovascular Disease | Admitting: Cardiovascular Disease

## 2019-07-27 DIAGNOSIS — Z79899 Other long term (current) drug therapy: Secondary | ICD-10-CM | POA: Insufficient documentation

## 2019-07-27 LAB — BASIC METABOLIC PANEL
Anion gap: 9 (ref 5–15)
BUN: 57 mg/dL — ABNORMAL HIGH (ref 8–23)
CO2: 22 mmol/L (ref 22–32)
Calcium: 8.3 mg/dL — ABNORMAL LOW (ref 8.9–10.3)
Chloride: 107 mmol/L (ref 98–111)
Creatinine, Ser: 2.26 mg/dL — ABNORMAL HIGH (ref 0.61–1.24)
GFR calc Af Amer: 32 mL/min — ABNORMAL LOW (ref >60)
GFR calc non Af Amer: 28 mL/min — ABNORMAL LOW (ref >60)
Glucose, Bld: 72 mg/dL (ref 70–99)
Potassium: 5 mmol/L (ref 3.5–5.1)
Sodium: 138 mmol/L (ref 135–145)

## 2019-07-28 ENCOUNTER — Other Ambulatory Visit: Payer: Self-pay | Admitting: *Deleted

## 2019-07-28 ENCOUNTER — Telehealth: Payer: Self-pay | Admitting: Cardiovascular Disease

## 2019-07-28 DIAGNOSIS — I5032 Chronic diastolic (congestive) heart failure: Secondary | ICD-10-CM

## 2019-07-28 DIAGNOSIS — N1831 Chronic kidney disease, stage 3a: Secondary | ICD-10-CM

## 2019-07-28 MED ORDER — FUROSEMIDE 20 MG PO TABS: 40.0000 mg | ORAL_TABLET | Freq: Two times a day (BID) | ORAL | 1 refills | Status: DC

## 2019-07-28 NOTE — Telephone Encounter (Signed)
He appears to have cardiorenal syndrome as each time and adequate diuretic dose is administered, he has an elevation in BUN and creatinine.  Increase Lasix to 40 mg twice daily for 5 days and then reduce back to 40 mg daily. Check BMET this upcoming Monday. Please make a referral to nephrology as well ([place]).

## 2019-07-28 NOTE — Telephone Encounter (Signed)
Reports swelling in both legs, stomach, audible wheezing and sob and weight gain. Weight today 169 lbs. Weight 3 days ago 163 lbs. Weight on Friday, 07/23/19 was 158 lbs. Was taking furosemide 20 mg daily and was increased to 40 mg on this past Friday 07/23/2019. Per wife, baseline weight is 145-150 lbs. Denies dizziness or chest pain, cough, fever, sore throat. Reports eating and drinking okay. Reports being able to lie down in bed to sleep. Advised that message would be sent to provider for advice. Advised if symptoms got worse, to go to the ED for an evaluation. Verbalized understanding of plan.

## 2019-07-28 NOTE — Telephone Encounter (Signed)
Has questions about her husband's fluid retention - would like to discuss medication changes. Stated that change to 20mg  has not worked and she increased to 40mg  and wanting to know if they still need to increase back to 80 mg

## 2019-07-28 NOTE — Addendum Note (Signed)
Addended by: Merlene Laughter on: 07/28/2019 11:56 AM   Modules accepted: Orders

## 2019-07-28 NOTE — Telephone Encounter (Signed)
Wife informed and verbalized understanding of plan. Lab order faxed to [facility] lab.

## 2019-08-02 ENCOUNTER — Other Ambulatory Visit: Payer: Self-pay

## 2019-08-02 ENCOUNTER — Telehealth: Payer: Self-pay

## 2019-08-02 ENCOUNTER — Other Ambulatory Visit (HOSPITAL_COMMUNITY)
Admission: RE | Admit: 2019-08-02 | Discharge: 2019-08-02 | Disposition: A | Discharge status: Home / Self Care | Payer: Medicare Other | Source: Ambulatory Visit | Attending: Cardiovascular Disease | Admitting: Cardiovascular Disease

## 2019-08-02 DIAGNOSIS — Z79899 Other long term (current) drug therapy: Secondary | ICD-10-CM

## 2019-08-02 DIAGNOSIS — I5032 Chronic diastolic (congestive) heart failure: Secondary | ICD-10-CM | POA: Insufficient documentation

## 2019-08-02 LAB — BASIC METABOLIC PANEL
Anion gap: 9 (ref 5–15)
BUN: 57 mg/dL — ABNORMAL HIGH (ref 8–23)
CO2: 26 mmol/L (ref 22–32)
Calcium: 8.5 mg/dL — ABNORMAL LOW (ref 8.9–10.3)
Chloride: 105 mmol/L (ref 98–111)
Creatinine, Ser: 2.2 mg/dL — ABNORMAL HIGH (ref 0.61–1.24)
GFR calc Af Amer: 33 mL/min — ABNORMAL LOW (ref >60)
GFR calc non Af Amer: 29 mL/min — ABNORMAL LOW (ref >60)
Glucose, Bld: 68 mg/dL — ABNORMAL LOW (ref 70–99)
Potassium: 4.5 mmol/L (ref 3.5–5.1)
Sodium: 140 mmol/L (ref 135–145)

## 2019-08-02 MED ORDER — FUROSEMIDE 20 MG PO TABS: 40.0000 mg | ORAL_TABLET | ORAL | 1 refills | Status: DC

## 2019-08-02 MED ORDER — FUROSEMIDE 20 MG PO TABS: 40.0000 mg | ORAL_TABLET | Freq: Every day | ORAL | 1 refills | Status: DC

## 2019-08-02 NOTE — Telephone Encounter (Signed)
Herminio Commons, MD  Bernita Raisin, RN        No, just stick to 40 mg daily. Don't reduce to 20 mg qod.

## 2019-08-02 NOTE — Telephone Encounter (Signed)
Patient has not reduced lasix to 20 mg every other day, will start tomorrow.He just finished extra dosing for 5 days. I will mail lab slip

## 2019-08-02 NOTE — Telephone Encounter (Signed)
-----   Message from Herminio Commons, MD sent at 07/27/2019 11:58 AM EDT ----- Renal function remained stable from 2 weeks ago but BUN and creatinine remain elevated.  If he has already reduced Lasix to 20 mg every other day, I may have him stop it altogether and weigh himself daily and should weight go up by 3 pounds in 24 hours to go ahead and take Lasix.  Repeat basic metabolic panel in 2 weeks.

## 2019-08-02 NOTE — Addendum Note (Signed)
Addended by: Barbarann Ehlers A on: 08/02/2019 01:12 PM   Modules accepted: Orders

## 2019-08-16 ENCOUNTER — Telehealth: Payer: Self-pay | Admitting: Cardiovascular Disease

## 2019-08-16 NOTE — Telephone Encounter (Signed)
Pt will be seeing Dr. Posey Pronto at [facility] and wanted Dr. Bronson Ing to know

## 2019-08-18 ENCOUNTER — Other Ambulatory Visit (HOSPITAL_COMMUNITY)
Admission: RE | Admit: 2019-08-18 | Discharge: 2019-08-18 | Disposition: A | Discharge status: Home / Self Care | Payer: Medicare Other | Source: Ambulatory Visit | Attending: Cardiovascular Disease | Admitting: Cardiovascular Disease

## 2019-08-18 DIAGNOSIS — Z79899 Other long term (current) drug therapy: Secondary | ICD-10-CM | POA: Diagnosis not present

## 2019-08-18 DIAGNOSIS — I5032 Chronic diastolic (congestive) heart failure: Secondary | ICD-10-CM | POA: Diagnosis not present

## 2019-08-18 LAB — BASIC METABOLIC PANEL
Anion gap: 11 (ref 5–15)
BUN: 56 mg/dL — ABNORMAL HIGH (ref 8–23)
CO2: 22 mmol/L (ref 22–32)
Calcium: 8.7 mg/dL — ABNORMAL LOW (ref 8.9–10.3)
Chloride: 109 mmol/L (ref 98–111)
Creatinine, Ser: 2.18 mg/dL — ABNORMAL HIGH (ref 0.61–1.24)
GFR calc Af Amer: 34 mL/min — ABNORMAL LOW (ref >60)
GFR calc non Af Amer: 29 mL/min — ABNORMAL LOW (ref >60)
Glucose, Bld: 130 mg/dL — ABNORMAL HIGH (ref 70–99)
Potassium: 5 mmol/L (ref 3.5–5.1)
Sodium: 142 mmol/L (ref 135–145)

## 2019-08-23 ENCOUNTER — Telehealth: Payer: Self-pay

## 2019-08-23 NOTE — Telephone Encounter (Signed)
Patient takes Lasix 40 mg daily and his renal apt is this Thursday

## 2019-08-23 NOTE — Telephone Encounter (Signed)
-----   Message from Arnoldo Lenis, MD sent at 08/23/2019  1:15 PM EST ----- Kidney function remains decreased but essentailly stable. How has he been taking his lasix most recently? I believe Dr Raliegh Ip had made some changes. When is his renal appt  Zandra Abts MD

## 2019-08-24 NOTE — Telephone Encounter (Signed)
Continue current diuretic, we will see what renal says on Thursday   J Jeffery Gammell MD

## 2019-08-26 DIAGNOSIS — I129 Hypertensive chronic kidney disease with stage 1 through stage 4 chronic kidney disease, or unspecified chronic kidney disease: Secondary | ICD-10-CM | POA: Diagnosis not present

## 2019-08-26 DIAGNOSIS — N189 Chronic kidney disease, unspecified: Secondary | ICD-10-CM | POA: Diagnosis not present

## 2019-08-26 DIAGNOSIS — N2581 Secondary hyperparathyroidism of renal origin: Secondary | ICD-10-CM | POA: Diagnosis not present

## 2019-08-26 DIAGNOSIS — N184 Chronic kidney disease, stage 4 (severe): Secondary | ICD-10-CM | POA: Diagnosis not present

## 2019-08-26 DIAGNOSIS — D631 Anemia in chronic kidney disease: Secondary | ICD-10-CM | POA: Diagnosis not present

## 2019-08-27 ENCOUNTER — Other Ambulatory Visit: Payer: Self-pay | Admitting: Nephrology

## 2019-08-27 ENCOUNTER — Other Ambulatory Visit (HOSPITAL_COMMUNITY): Payer: Self-pay | Admitting: Nephrology

## 2019-08-27 DIAGNOSIS — N184 Chronic kidney disease, stage 4 (severe): Secondary | ICD-10-CM

## 2019-09-03 ENCOUNTER — Other Ambulatory Visit: Payer: Self-pay

## 2019-09-03 ENCOUNTER — Ambulatory Visit (HOSPITAL_COMMUNITY)
Admission: RE | Admit: 2019-09-03 | Discharge: 2019-09-03 | Disposition: A | Discharge status: Home / Self Care | Payer: Medicare Other | Source: Ambulatory Visit | Attending: Nephrology | Admitting: Nephrology

## 2019-09-03 DIAGNOSIS — N200 Calculus of kidney: Secondary | ICD-10-CM | POA: Diagnosis not present

## 2019-09-03 DIAGNOSIS — N184 Chronic kidney disease, stage 4 (severe): Secondary | ICD-10-CM | POA: Insufficient documentation

## 2019-09-30 ENCOUNTER — Encounter: Payer: Self-pay | Admitting: *Deleted

## 2019-10-01 ENCOUNTER — Ambulatory Visit (INDEPENDENT_AMBULATORY_CARE_PROVIDER_SITE_OTHER): Payer: Medicare Other | Admitting: Cardiovascular Disease

## 2019-10-01 ENCOUNTER — Other Ambulatory Visit: Payer: Self-pay

## 2019-10-01 ENCOUNTER — Encounter: Payer: Self-pay | Admitting: Cardiovascular Disease

## 2019-10-01 VITALS — BP 187/87 | HR 70 | Ht 67.0 in | Wt 164.2 lb

## 2019-10-01 DIAGNOSIS — E1165 Type 2 diabetes mellitus with hyperglycemia: Secondary | ICD-10-CM

## 2019-10-01 DIAGNOSIS — I5032 Chronic diastolic (congestive) heart failure: Secondary | ICD-10-CM | POA: Diagnosis not present

## 2019-10-01 DIAGNOSIS — R0602 Shortness of breath: Secondary | ICD-10-CM

## 2019-10-01 DIAGNOSIS — I2721 Secondary pulmonary arterial hypertension: Secondary | ICD-10-CM

## 2019-10-01 DIAGNOSIS — R0609 Other forms of dyspnea: Secondary | ICD-10-CM

## 2019-10-01 DIAGNOSIS — N1831 Chronic kidney disease, stage 3a: Secondary | ICD-10-CM | POA: Diagnosis not present

## 2019-10-01 DIAGNOSIS — I10 Essential (primary) hypertension: Secondary | ICD-10-CM

## 2019-10-01 DIAGNOSIS — R0600 Dyspnea, unspecified: Secondary | ICD-10-CM

## 2019-10-01 DIAGNOSIS — Z952 Presence of prosthetic heart valve: Secondary | ICD-10-CM | POA: Diagnosis not present

## 2019-10-01 DIAGNOSIS — Z951 Presence of aortocoronary bypass graft: Secondary | ICD-10-CM

## 2019-10-01 NOTE — Patient Instructions (Signed)
Medication Instructions:  Continue all current medications.  Labwork: none  Testing/Procedures:  A chest x-ray takes a picture of the organs and structures inside the chest, including the heart, lungs, and blood vessels. This test can show several things, including, whether the heart is enlarges; whether fluid is building up in the lungs; and whether pacemaker / defibrillator leads are still in place.  Office will contact with results via phone or letter.    Follow-Up: Your physician wants you to follow up in: 6 months.  You will receive a reminder letter in the mail one-two months in advance.  If you don't receive a letter, please call our office to schedule the follow up appointment   Any Other Special Instructions Will Be Listed Below (If Applicable).  If you need a refill on your cardiac medications before your next appointment, please call your pharmacy.

## 2019-10-01 NOTE — Progress Notes (Signed)
SUBJECTIVE: Patient presents for follow-up of chronic diastolic heart failure.  Past medical history also includes hypertension, diabetes, gout, chronic kidney disease stage III, history of bioprosthetic aortic valve replacement, and possibly one-vessel CABG (at [facility], no records received).  Echocardiogram on 12/09/2018 demonstrated normal LV systolic function, EF 55 to 60%, severe LVH, diastolic dysfunction, mild right ventricular enlargement, severe left atrial and moderate right atrial dilatation, trivial pericardial effusion, normally functioning bioprosthetic aortic valve, mild to moderate mitral regurgitation, moderate tricuspid regurgitation, and severe pulmonary hypertension.  He underwent bioprosthetic Edwards aortic valve replacement on 12/28/2009 at [facility] (Edwards 25 mm 3300 TFX, Serial # [id number]). He may have also undergone one-vessel CABG but that detail is unclear to me.  Chronic exertional dyspnea is stable.  He gets short of breath if he walks to the mailbox and back.  He weighs himself daily and his weight this morning at home was 157 pounds which is his normal weight.  I reviewed a blood pressure log as well which showed readings of 136-157/70 range.  He now follows with Dr. Elmarie Shiley in [place] for his nephrology care.  He has cardiorenal syndrome.  He underwent a renal ultrasound on 09/03/2019 which showed bilateral nonobstructive nephrolithiasis.  There are also bilateral pleural effusions noted.    Soc Hx:They moved from [place] initially to [place] and then to [place] about a few years ago. 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 His wife has several relatives who used to teach skiing at [employer] in [place].  He has several family members who are physicians.  Review of Systems: As per "subjective", otherwise negative.  Allergies  Allergen Reactions  . Bee Venom Shortness Of Breath and Swelling  .  Ace Inhibitors Other (See Comments)    Elevated potassium    Current Outpatient Medications  Medication Sig Dispense Refill  . allopurinol (ZYLOPRIM) 100 MG tablet Take 100 mg by mouth daily.     Marland Kitchen amLODipine (NORVASC) 5 MG tablet Take 1 tablet (5 mg total) by mouth daily. 90 tablet 1  . carvedilol (COREG) 6.25 MG tablet Take 1 tablet (6.25 mg total) by mouth 2 (two) times daily with a meal. 180 tablet 1  . furosemide (LASIX) 40 MG tablet Take 1 tablet by mouth 2 (two) times daily.    . Insulin Glargine (LANTUS SOLOSTAR) 100 UNIT/ML Solostar Pen Inject 30 Units into the skin daily.     . psyllium (METAMUCIL) 58.6 % packet Take 1 packet by mouth daily.     No current facility-administered medications for this visit.    Past Medical History:  Diagnosis Date  . Diabetes ([place])   . Gout   . Hyperlipidemia   . Hypertension   . Presence of prosthetic heart valve     Past Surgical History:  Procedure Laterality Date  . AORTIC VALVE REPLACEMENT  2011   pig valve  . EYE SURGERY Bilateral 2011   cataract removed from right and left eye  . FOOT SURGERY Left 2006   gout cyst removed    Social History   Socioeconomic History  . Marital status: Married    Spouse name: Not on file  . Number of children: Not on file  . Years of education: Not on file  . Highest education level: Not on file  Occupational History  . Not on file  Tobacco Use  . Smoking status: Former Smoker    Types: Pipe  . Smokeless tobacco:  Never Used  Substance and Sexual Activity  . Alcohol use: Not on file    Comment: OCCASIONAL  . Drug use: Never  . Sexual activity: Not on file  Other Topics Concern  . Not on file  Social History Narrative  . Not on file   Social Determinants of Health   Financial Resource Strain:   . Difficulty of Paying Living Expenses: Not on file  Food Insecurity:   . Worried About Charity fundraiser in the Last Year: Not on file  . Ran Out of Food in the Last Year: Not on  file  Transportation Needs:   . Lack of Transportation (Medical): Not on file  . Lack of Transportation (Non-Medical): Not on file  Physical Activity:   . Days of Exercise per Week: Not on file  . Minutes of Exercise per Session: Not on file  Stress:   . Feeling of Stress : Not on file  Social Connections:   . Frequency of Communication with Friends and Family: Not on file  . Frequency of Social Gatherings with Friends and Family: Not on file  . Attends Religious Services: Not on file  . Active Member of Clubs or Organizations: Not on file  . Attends Archivist Meetings: Not on file  . Marital Status: Not on file  Intimate Partner Violence:   . Fear of Current or Ex-Partner: Not on file  . Emotionally Abused: Not on file  . Physically Abused: Not on file  . Sexually Abused: Not on file     Vitals:   10/01/19 1447  BP: (!) 187/87  Pulse: 70  SpO2: 94%  Weight: 164 lb 3.2 oz (74.5 kg)  Height: 5\' 7"  (1.702 m)    Wt Readings from Last 3 Encounters:  10/01/19 164 lb 3.2 oz (74.5 kg)  06/25/19 141 lb (64 kg)  06/03/19 173 lb 9.6 oz (78.7 kg)     PHYSICAL EXAM General: NAD HEENT: Normal. Neck: No JVD, no thyromegaly. Lungs: Diminished breath sounds at bases bilaterally. CV: Regular rate and rhythm, normal S1/S2, no [id number], soft systolic murmur. No pretibial or periankle edema.   Abdomen: Soft, nontender, no distention.  Neurologic: Alert and oriented.  Psych: Normal affect. Skin: Normal. Musculoskeletal: No gross deformities.      Labs: Lab Results  Component Value Date/Time   K 5.0 08/18/2019 09:55 AM   BUN 56 (H) 08/18/2019 09:55 AM   CREATININE 2.18 (H) 08/18/2019 09:55 AM     Lipids: No results found for: LDLCALC, LDLDIRECT, CHOL, TRIG, HDL     ASSESSMENT AND PLAN: 1. Severe pulmonary hypertension/Chronic diastolic heart failure: Weight up 23 pounds from 06/25/2019 by our scales but he says he normally weighs 157 pounds at home and weight  157 pounds today.  He weighs himself daily.  He is hypertensive today with fluctuating blood pressures at home. Currently on carvedilol 6.25 mg twice daily and amlodipine 5 mg daily.  If they remain elevated, I will add hydralazine 25 mg 3 times daily. I suspect severe pulmonary hypertension is due to severely elevated blood pressures and consequent diastolic dysfunction. Currently on Lasix 40 mg twice daily with diuretic management by nephrology.  I informed him to weigh himself daily and should weight increase between 3-5 pounds in 24 hours, to take an extra dose of Lasix. I will obtain a chest x-ray.  2.Accelerated hypertension: Currently on carvedilol 6.25 mg twice daily and amlodipine 5 mg daily.  He is hypertensive today with fluctuating blood  pressures at home. Currently on carvedilol 6.25 mg twice daily and amlodipine 5 mg daily.  If they remain elevated, I will add hydralazine 25 mg 3 times daily.   3. Chronic kidney disease stage IV: He needs aggressive control of blood pressure and diabetes. He follows with Dr. Elmarie Shiley.  BUN 56 and creatinine 2.18 on 08/18/2019.  4. Type 2 diabetes mellitus: Currently on Lantus. He meets criteria for statin therapy. He also may have a history of one-vessel CABG. A1c greater than 9%.  5. Status post bioprosthetic aortic valve replacement:He underwent bioprosthetic Edwards aortic valve replacement on 12/28/2009 at [facility] (Edwards 25 mm 3300 TFX, Serial # [id number]). Normally functioning bioprosthesis by echocardiogram in February 2020.     Disposition: Follow up 6 months virtual visit   Kate Sable, M.D., F.A.C.C.

## 2019-10-04 ENCOUNTER — Ambulatory Visit (HOSPITAL_COMMUNITY)
Admission: RE | Admit: 2019-10-04 | Discharge: 2019-10-04 | Disposition: A | Discharge status: Home / Self Care | Payer: Medicare Other | Source: Ambulatory Visit | Attending: Cardiovascular Disease | Admitting: Cardiovascular Disease

## 2019-10-04 ENCOUNTER — Other Ambulatory Visit: Payer: Self-pay

## 2019-10-04 DIAGNOSIS — R0602 Shortness of breath: Secondary | ICD-10-CM | POA: Diagnosis not present

## 2019-10-04 DIAGNOSIS — R062 Wheezing: Secondary | ICD-10-CM | POA: Insufficient documentation

## 2019-10-11 ENCOUNTER — Encounter: Payer: Self-pay | Admitting: Cardiovascular Disease

## 2019-10-18 ENCOUNTER — Other Ambulatory Visit: Payer: Self-pay | Admitting: Cardiovascular Disease

## 2019-10-18 MED ORDER — FUROSEMIDE 40 MG PO TABS: 40.0000 mg | ORAL_TABLET | Freq: Two times a day (BID) | ORAL | 2 refills | Status: DC

## 2019-10-18 NOTE — Telephone Encounter (Signed)
*  STAT* If patient is at the pharmacy, call can be transferred to refill team. ° ° °1. Which medications need to be refilled?  °furosemide (LASIX) 40 MG tablet ° ° °2. Which pharmacy/location (including street and city if local pharmacy) is medication to be sent to?[facility] ° °3. Do they need a 30 day or 90 day supply?  ° °

## 2019-11-09 ENCOUNTER — Encounter: Payer: Self-pay | Admitting: *Deleted

## 2019-11-10 ENCOUNTER — Other Ambulatory Visit: Payer: Self-pay | Admitting: Cardiovascular Disease

## 2019-11-10 MED ORDER — AMLODIPINE BESYLATE 5 MG PO TABS: 5.0000 mg | ORAL_TABLET | Freq: Every day | ORAL | 1 refills | Status: DC

## 2019-11-10 NOTE — Telephone Encounter (Signed)
Medication sent to pharmacy  

## 2019-11-10 NOTE — Telephone Encounter (Signed)
     1. Which medications need to be refilled? (please list name of each medication and dose if known)  amLODipine (NORVASC) 5 MG tablet    2. Which pharmacy/location (including street and city if local pharmacy) is medication to be sent to?  [facility] [place]    3. Do they need a 30 day or 90 day supply?

## 2019-12-15 IMAGING — US US RENAL
1 series · 14 of 25 positions shown · non-contrast
Comparison: None available.

CLINICAL DATA: Initial evaluation for chronic kidney disease, stage
IV.

EXAM:
RENAL / URINARY TRACT ULTRASOUND COMPLETE

[Series 1: us renal · 14 of 63 slices shown]
[im 1/63]
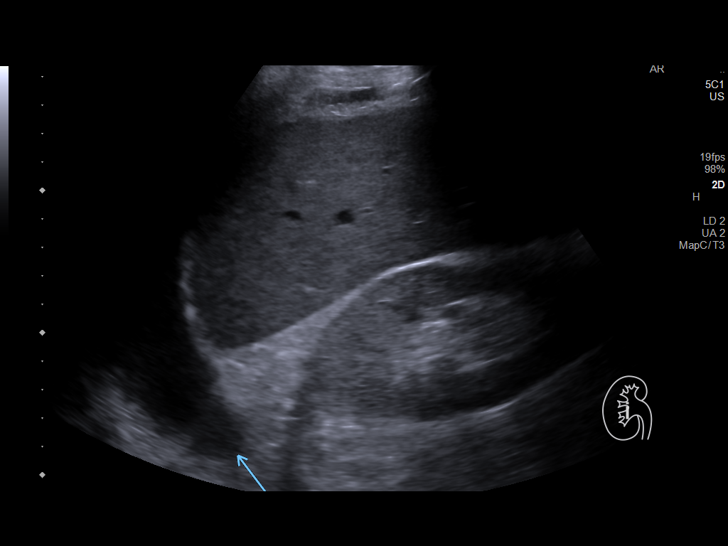
[im 6/63]
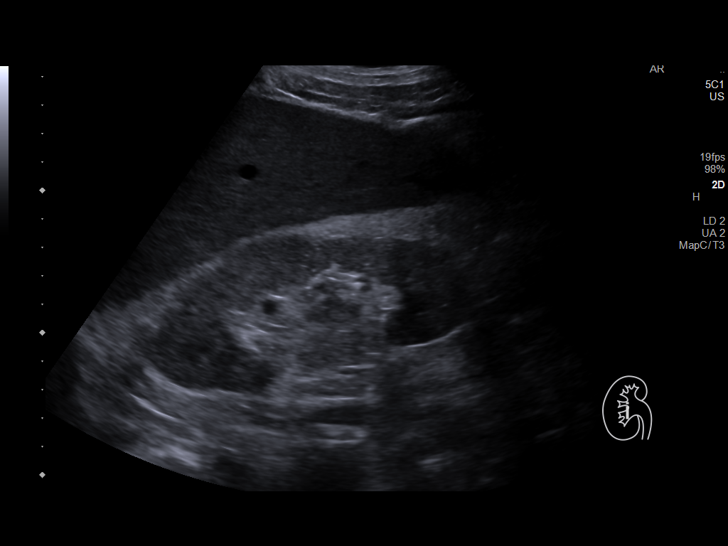
[im 11/63]
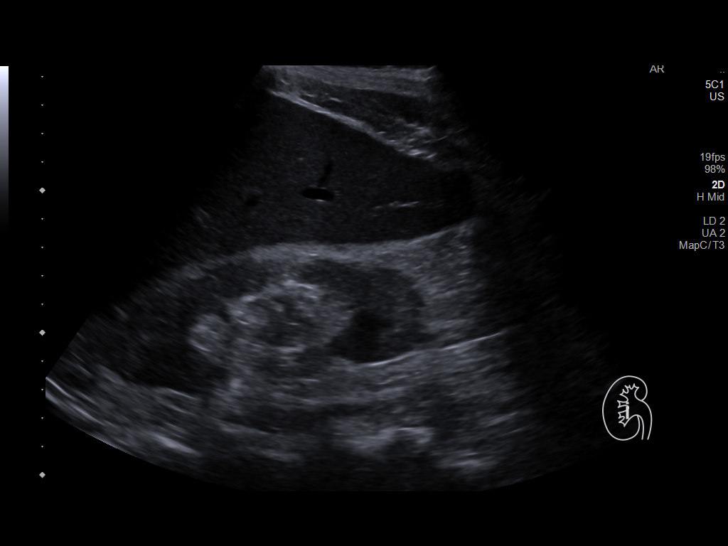
[im 16/63]
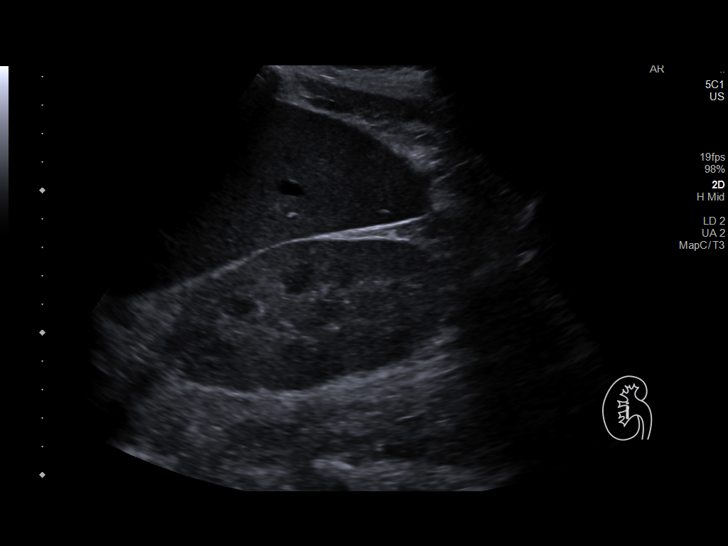
[im 21/63]
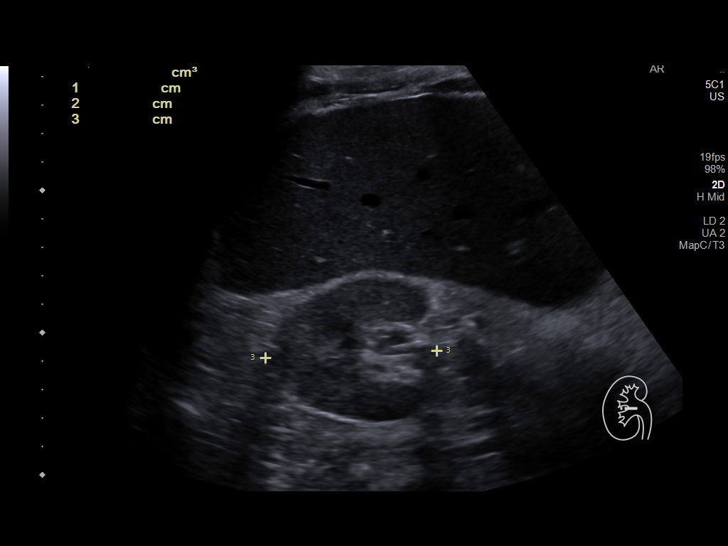
[im 24/63]
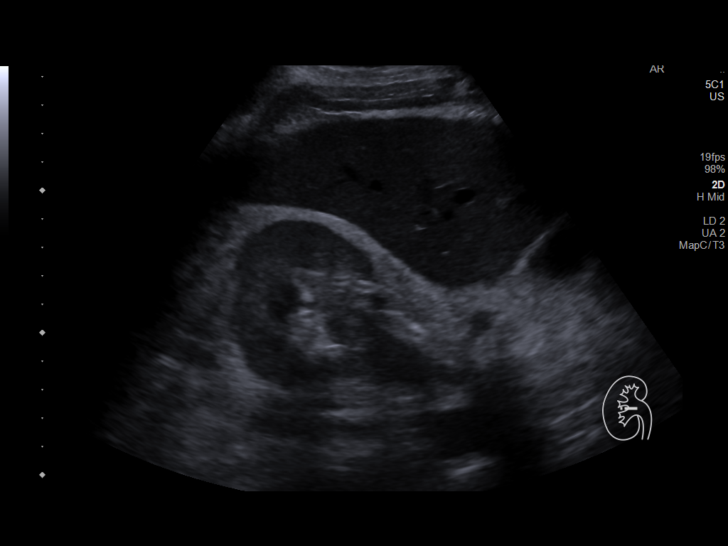
[im 29/63]
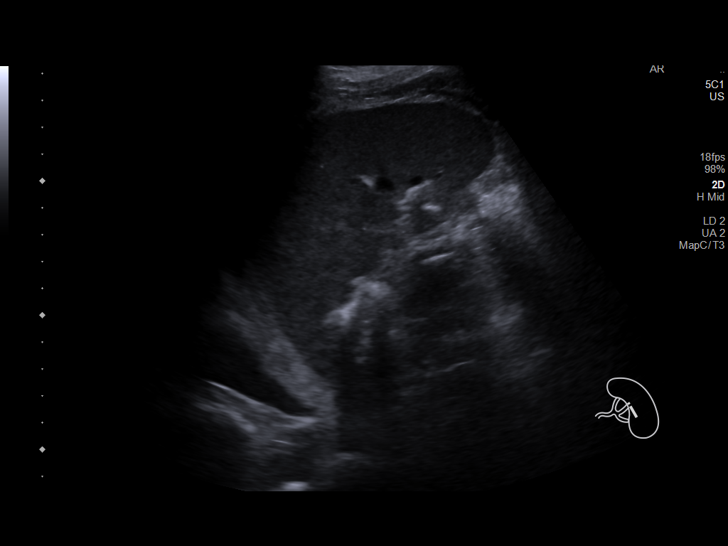
[im 34/63]
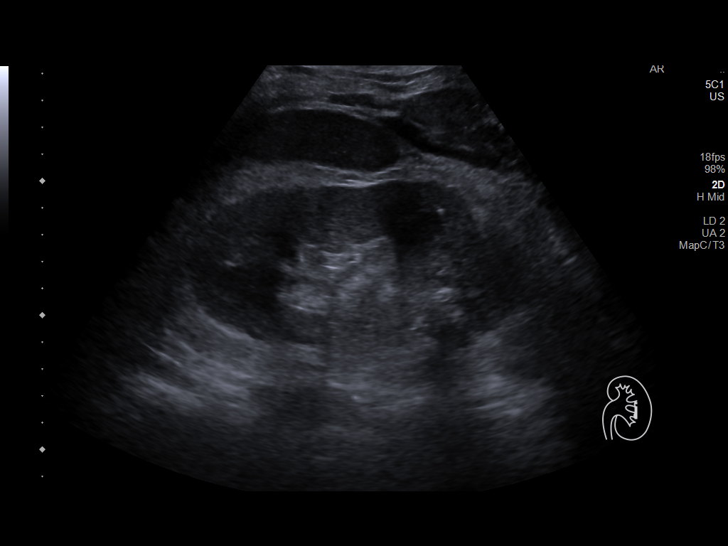
[im 39/63]
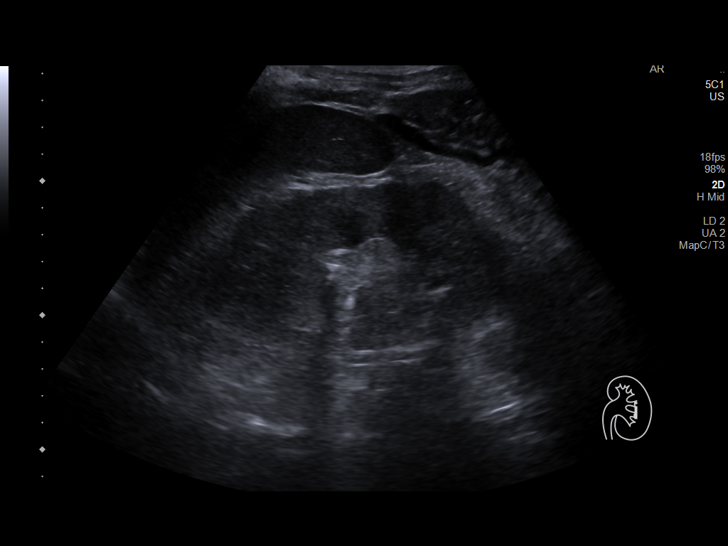
[im 42/63]
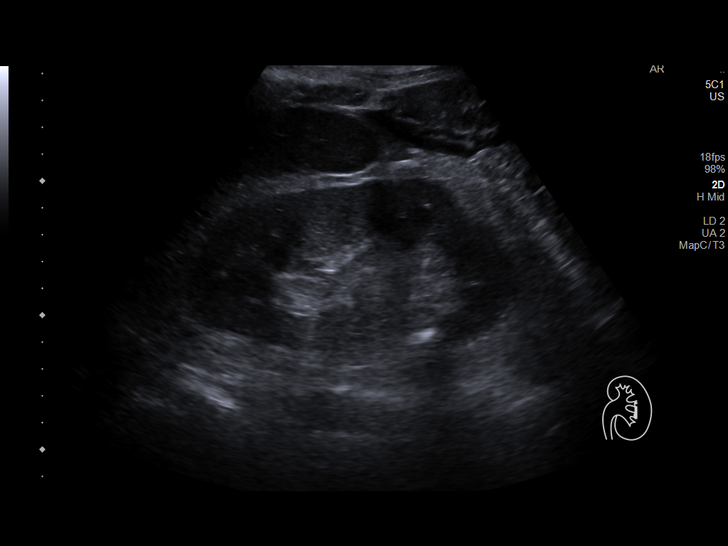
[im 47/63]
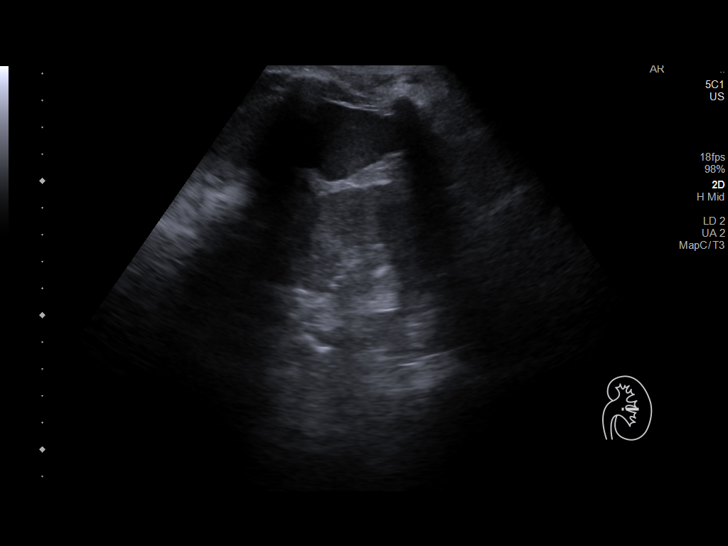
[im 52/63]
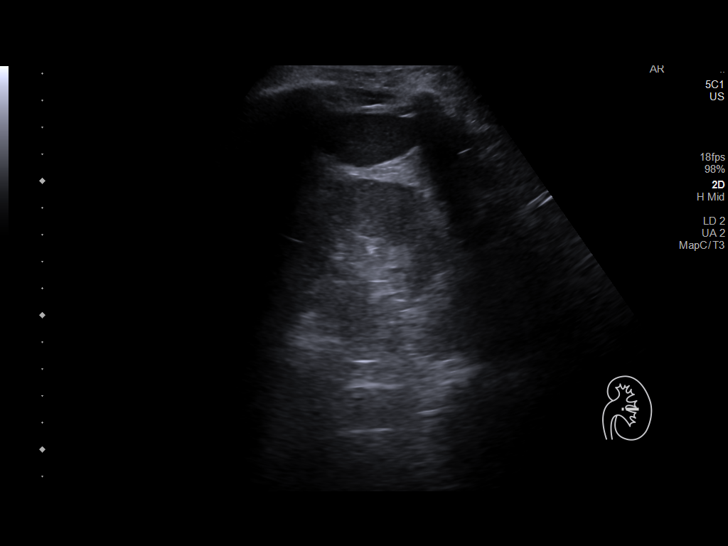
[im 57/63]
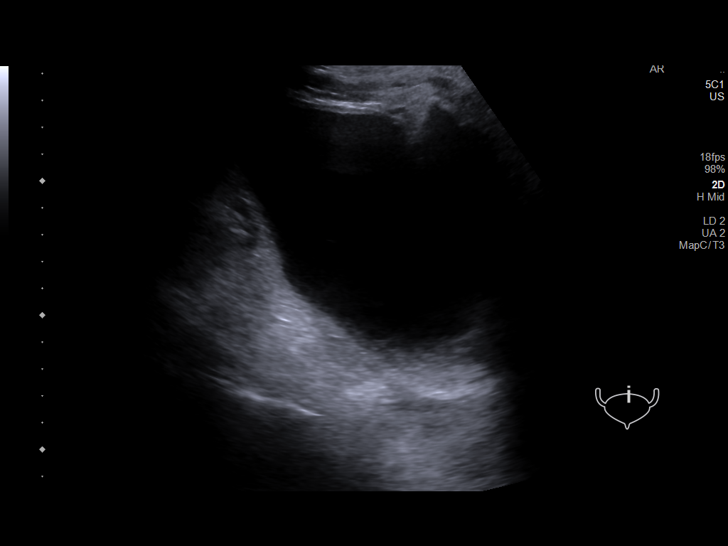
[im 63/63]
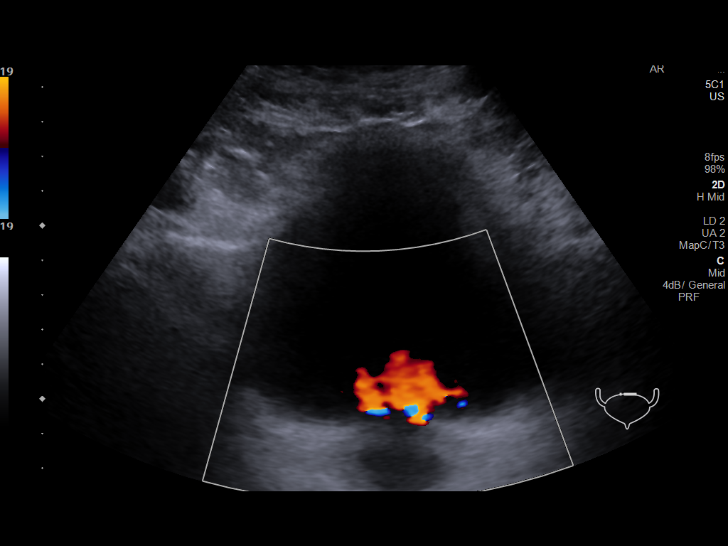

[14 of 25 positions shown; findings below may reference images not displayed]

FINDINGS: Right Kidney:

Renal measurements: 12.0 x 5.9 x 6.0 cm = volume: 222.8 mL .
Echogenicity within normal limits. 6 mm shadowing stone present at
the upper pole. No hydronephrosis. No discrete renal mass.

Left Kidney:

Renal measurements: 13.8 x 6.9 x 5.9 cm = volume: 296.2 mL.
Echogenicity within normal limits. 9 mm shadowing stone present at
the upper pole. No hydronephrosis. No discrete renal mass.

Bladder:

Appears normal for degree of bladder distention. Bilateral ureteral
jets are visualized.

Other:

Incidental note made of bilateral pleural effusions.
IMPRESSION: 1. Bilateral nonobstructive nephrolithiasis, measuring 6 mm on the
right and 9 mm on the left.
2. Otherwise normal renal ultrasound.
3. Bilateral pleural effusions.

## 2019-12-28 ENCOUNTER — Other Ambulatory Visit: Payer: Self-pay | Admitting: Cardiovascular Disease

## 2019-12-28 MED ORDER — CARVEDILOL 6.25 MG PO TABS: 6.2500 mg | ORAL_TABLET | Freq: Two times a day (BID) | ORAL | 1 refills | Status: DC

## 2019-12-28 NOTE — Telephone Encounter (Signed)
    1. Which medications need to be refilled? (please list name of each medication and dose if known)  Carvedilol 6.25 mg   2. Which pharmacy/location (including street and city if local pharmacy) is medication to be sent to? [place]   3. Do they need a 30 day or 90 day supply?

## 2019-12-28 NOTE — Telephone Encounter (Signed)
Medication sent to pharmacy  

## 2020-05-16 ENCOUNTER — Telehealth: Payer: Self-pay | Admitting: Family Medicine

## 2020-05-16 MED ORDER — AMLODIPINE BESYLATE 5 MG PO TABS: 5.0000 mg | ORAL_TABLET | Freq: Every day | ORAL | 1 refills | Status: AC

## 2020-05-16 NOTE — Telephone Encounter (Signed)
Medication sent to pharmacy  

## 2020-05-16 NOTE — Telephone Encounter (Signed)
*  STAT* If patient is at the pharmacy, call can be transferred to refill team.   1. Which medications need to be refilled? (please list name of each medication and dose if known) amLODipine (NORVASC) 5 MG tablet [[id number]] ENDED   2. Which pharmacy/location (including street and city if local pharmacy) is medication to be sent to? [facility]  3. Do they need a 30 day or 90 day supply?  90 day

## 2020-06-04 NOTE — Progress Notes (Addendum)
Cardiology Office Note  Date: 06/05/2020   ID: Keith, Parrish Aug 24, 1946, MRN [id number]  PCP:  Rory Percy, MD  Cardiologist:  No primary care provider on file. Electrophysiologist:  None   Chief Complaint: Follow-up shortness of breath, severe pulmonary heart hypertension, chronic diastolic heart failure, accelerated hypertension, chronic kidney disease stage IV, status post bioprosthetic aortic valve replacement.,  DM type II,  History of Present Illness: Keith Parrish is a 74 y.o. male with a history of shortness of breath, severe pulmonary heart hypertension, chronic diastolic heart failure, accelerated hypertension, chronic kidney disease stage IV, status post bioprosthetic aortic valve replacement.,  DM type II, cardiorenal syndrome following with nephrology.  CAD, potentially had one-vessel CABG at [facility]. No records received.   Echocardiogram 12/09/2018 normal LV EF 55 to 60%, severe LVH, DD, mild RV enlargement, severe left atrial dilatation and moderate right atrial dilatation, trivial pericardial effusion, normally functioning bioprosthetic AV, mild to moderate MR, moderate TR, severe pulmonary hypertension.   Saw Dr. Bronson Ing 10/01/2019.  His chronic exertional dyspnea was stable.  He would become short of breath if walking to mailbox and back.  His weight was stable.  He was following with Dr. Graylon Gunning in [place] for his nephrology care secondary to cardiorenal syndrome.  He had undergone a renal ultrasound 09/03/2019 showing bilateral nonobstructive nephrolithiasis and noted incidental bilateral pleural effusions.  Patient is here for 20-month follow-up.  He denies any recent anginal or exertional symptoms, palpitations or arrhythmias, orthostatic symptoms, PND, orthopnea, weight gain, edema.  Denies any CVA or TIA-like symptoms, claudication-like symptoms, DVT or PE-like symptoms or lower extremity edema.  States he needs refills on his cardiac medications.   States he is having a new problem with his right eye having some blurred vision.  He has insulin-dependent diabetes and has not had an eye exam in quite some time.  He has had cataract surgery in the past and wonders if it could be a new cataract forming.  He has a scheduled follow-up with ophthalmology.  Suspect could be related to possible diabetic retinopathy.  Also kidney disease more than likely related to nephropathy from diabetes/ combined with cardiorenal syndrome.  Patient states he rarely checks his blood sugar.   Past Medical History:  Diagnosis Date  . Diabetes ([place])   . Gout   . Hyperlipidemia   . Hypertension   . Presence of prosthetic heart valve     Past Surgical History:  Procedure Laterality Date  . AORTIC VALVE REPLACEMENT  2011   pig valve  . EYE SURGERY Bilateral 2011   cataract removed from right and left eye  . FOOT SURGERY Left 2006   gout cyst removed    Current Outpatient Medications  Medication Sig Dispense Refill  . allopurinol (ZYLOPRIM) 100 MG tablet Take 100 mg by mouth daily.     Marland Kitchen amLODipine (NORVASC) 5 MG tablet Take 1 tablet (5 mg total) by mouth daily. 90 tablet 1  . carvedilol (COREG) 6.25 MG tablet Take 1 tablet (6.25 mg total) by mouth 2 (two) times daily with a meal. 180 tablet 1  . furosemide (LASIX) 40 MG tablet Take 1 tablet (40 mg total) by mouth 2 (two) times daily. 180 tablet 2  . Insulin Glargine (LANTUS SOLOSTAR) 100 UNIT/ML Solostar Pen Inject 30 Units into the skin daily.     . psyllium (METAMUCIL) 58.6 % packet Take 1 packet by mouth daily.     No current facility-administered medications for this  visit.   Allergies:  Bee venom and Ace inhibitors   Social History: The patient  reports that he has quit smoking. His smoking use included pipe. He has never used smokeless tobacco. He reports that he does not use drugs.   Family History: The patient's family history includes Other in his brother.   ROS:  Please see the history of  present illness. Otherwise, complete review of systems is positive for none.  All other systems are reviewed and negative.   Physical Exam: VS:  BP 110/64   Pulse 81   Ht 5\' 6"  (1.676 m)   Wt 164 lb (74.4 kg)   SpO2 92%   BMI 26.47 kg/m , BMI Body mass index is 26.47 kg/m.  Wt Readings from Last 3 Encounters:  06/05/20 164 lb (74.4 kg)  10/01/19 164 lb 3.2 oz (74.5 kg)  06/25/19 141 lb (64 kg)    General: Patient appears comfortable at rest. Neck: Supple, no elevated JVP or carotid bruits, no thyromegaly. Lungs: Clear to auscultation, nonlabored breathing at rest. Cardiac: Regular rate and rhythm, no S3 or significant systolic murmur, no pericardial rub. Extremities: No pitting edema, distal pulses 2+. Skin: Warm and dry. Musculoskeletal: No kyphosis. Neuropsychiatric: Alert and oriented x3, affect grossly appropriate.  ECG:  An ECG dated 06/05/2020 was personally reviewed today and demonstrated:  Normal sinus rhythm right superior axis deviation heart rate of 68.  Recent Labwork: 08/18/2019: BUN 56; Creatinine, Ser 2.18; Potassium 5.0; Sodium 142  No results found for: CHOL, TRIG, HDL, CHOLHDL, VLDL, LDLCALC, LDLDIRECT  Other Studies Reviewed Today:  Echo 11/2018 IMPRESSIONS    1. The left ventricle has normal systolic function, with an ejection  fraction of 55-60%. The cavity size was normal. There is severely  increased left ventricular wall thickness. Abnormal diastolic function,  indeterminant grade.  2. The right ventricle has normal systolic function. The cavity was  mildly enlarged. There is no increase in right ventricular wall thickness.  3. Left atrial size was severely dilated.  4. Right atrial size was moderately dilated.  5. The pericardial effusion is circumferential.  6. Trivial pericardial effusion is present.  7. Bioprosthetic aortic valve unknown type in the AV position Moderate  thickening of the aortic valve Moderate calcification of the  aortic valve.  no stenosis of the aortic valve. Moderate aortic annular calcification  noted.  8. The mitral valve is normal in structure. Moderate thickening of the  mitral valve leaflet. Moderate calcification of the mitral valve leaflet.  There is moderate mitral annular calcification present. Mitral valve  regurgitation is mild to moderate by  color flow Doppler. No evidence of mitral valve stenosis.  9. The tricuspid valve is not well visualized. Tricuspid valve  regurgitation is moderate.  10. A an Edwards bioprosthesis valve is present in the aortic position.  Procedure Date: 2011.  11. The aortic root is normal in size and structure.  12. Pulmonary hypertension is Severe pulmonay hypertension, PASP is 90  mmHg.  13. Mild hypokinesis of the left ventricular anteroseptal wall.   Renal ultrasound 09/03/2019 IMPRESSION: 1. Bilateral nonobstructive nephrolithiasis, measuring 6 mm on the right and 9 mm on the left. 2. Otherwise normal renal ultrasound. 3. Bilateral pleural effusions   Assessment and Plan:  1. Severe pulmonary arterial systolic hypertension ([place])   2. Chronic diastolic heart failure ([place])   3. Essential hypertension   4. Chronic kidney disease, stage IV (severe) ([place])   5. History of aortic valve replacement with bioprosthetic  valve   6. Uncontrolled type 2 diabetes mellitus with hyperglycemia ([place])    1. Severe pulmonary arterial systolic hypertension ([place]) On echocardiogram February 2020 showed severe pulmonay hypertension, PASP is 90  mmHg.  Patient denies any significant shortness of breath, weight gain, or lower extremity edema.  2. Chronic diastolic heart failure ([place]) Echo February 2020. Abnormal diastolic function, indeterminant grade.  Continue Lasix 40 mg p.o. twice daily.  Patient denies any recent DOE, weight gain, or lower extremity edema.  He states he weighs himself daily and usually weighs around 148 pounds.  3. Essential  hypertension Blood pressure well controlled on current therapy.  Continue amlodipine 5 mg daily.  Carvedilol 6.25 mg p.o. twice daily.  4. Chronic kidney disease, stage IV (severe) (HCC) Last renal function test 10/11/2019 showed BUN 50, creatinine 2.14 GFR 30.  5.   History of aortic valve replacement with bioprosthetic valve Edwards bioprosthesis valve is present in the aortic position.  Procedure Date: 2011.   6. Uncontrolled type 2 diabetes mellitus with hyperglycemia ([place]) Patient has uncontrolled diabetes.  Currently on Lantus insulin 30 units in the evening daily.  Patient states he rarely checks his blood sugar.  Medication Adjustments/Labs and Tests Ordered: Current medicines are reviewed at length with the patient today.  Concerns regarding medicines are outlined above.   Disposition: Follow-up with Dr. Harl Bowie or APP 6 months  Signed, Levell July, NP 06/05/2020 11:16 AM    [facility] [place], [place], [place] [address] Phone: [phone]; Fax: [phone]

## 2020-06-05 ENCOUNTER — Ambulatory Visit (INDEPENDENT_AMBULATORY_CARE_PROVIDER_SITE_OTHER): Payer: Medicare Other | Admitting: Family Medicine

## 2020-06-05 ENCOUNTER — Encounter: Payer: Self-pay | Admitting: Family Medicine

## 2020-06-05 ENCOUNTER — Other Ambulatory Visit: Payer: Self-pay

## 2020-06-05 VITALS — BP 110/64 | HR 81 | Ht 66.0 in | Wt 164.0 lb

## 2020-06-05 DIAGNOSIS — N184 Chronic kidney disease, stage 4 (severe): Secondary | ICD-10-CM | POA: Diagnosis not present

## 2020-06-05 DIAGNOSIS — I2721 Secondary pulmonary arterial hypertension: Secondary | ICD-10-CM | POA: Diagnosis not present

## 2020-06-05 DIAGNOSIS — I10 Essential (primary) hypertension: Secondary | ICD-10-CM

## 2020-06-05 DIAGNOSIS — I5032 Chronic diastolic (congestive) heart failure: Secondary | ICD-10-CM | POA: Diagnosis not present

## 2020-06-05 DIAGNOSIS — Z953 Presence of xenogenic heart valve: Secondary | ICD-10-CM | POA: Diagnosis not present

## 2020-06-05 DIAGNOSIS — E1165 Type 2 diabetes mellitus with hyperglycemia: Secondary | ICD-10-CM

## 2020-06-05 MED ORDER — FUROSEMIDE 40 MG PO TABS
40.0000 mg | ORAL_TABLET | Freq: Two times a day (BID) | ORAL | 2 refills | Status: DC
Start: 2020-06-05 — End: 2020-07-19

## 2020-06-05 MED ORDER — CARVEDILOL 6.25 MG PO TABS: 6.2500 mg | ORAL_TABLET | Freq: Two times a day (BID) | ORAL | 2 refills | Status: AC

## 2020-06-05 NOTE — Patient Instructions (Signed)
Medication Instructions:  Continue all current medications.   Labwork: none  Testing/Procedures: none  Follow-Up: 6 months   Any Other Special Instructions Will Be Listed Below (If Applicable).   If you need a refill on your cardiac medications before your next appointment, please call your pharmacy.  

## 2020-06-06 DIAGNOSIS — H33051 Total retinal detachment, right eye: Secondary | ICD-10-CM | POA: Diagnosis not present

## 2020-06-06 DIAGNOSIS — H524 Presbyopia: Secondary | ICD-10-CM | POA: Diagnosis not present

## 2020-06-06 DIAGNOSIS — E109 Type 1 diabetes mellitus without complications: Secondary | ICD-10-CM | POA: Diagnosis not present

## 2020-06-06 DIAGNOSIS — E103493 Type 1 diabetes mellitus with severe nonproliferative diabetic retinopathy without macular edema, bilateral: Secondary | ICD-10-CM | POA: Diagnosis not present

## 2020-06-06 DIAGNOSIS — Z961 Presence of intraocular lens: Secondary | ICD-10-CM | POA: Diagnosis not present

## 2020-06-07 ENCOUNTER — Encounter (INDEPENDENT_AMBULATORY_CARE_PROVIDER_SITE_OTHER): Payer: Self-pay | Admitting: Ophthalmology

## 2020-06-07 ENCOUNTER — Other Ambulatory Visit: Payer: Self-pay

## 2020-06-07 ENCOUNTER — Ambulatory Visit (INDEPENDENT_AMBULATORY_CARE_PROVIDER_SITE_OTHER): Payer: Medicare Other | Admitting: Ophthalmology

## 2020-06-07 DIAGNOSIS — Z794 Long term (current) use of insulin: Secondary | ICD-10-CM

## 2020-06-07 DIAGNOSIS — IMO0002 Reserved for concepts with insufficient information to code with codable children: Secondary | ICD-10-CM | POA: Insufficient documentation

## 2020-06-07 DIAGNOSIS — E113521 Type 2 diabetes mellitus with proliferative diabetic retinopathy with traction retinal detachment involving the macula, right eye: Secondary | ICD-10-CM | POA: Diagnosis not present

## 2020-06-07 DIAGNOSIS — H4311 Vitreous hemorrhage, right eye: Secondary | ICD-10-CM | POA: Diagnosis not present

## 2020-06-07 DIAGNOSIS — E113592 Type 2 diabetes mellitus with proliferative diabetic retinopathy without macular edema, left eye: Secondary | ICD-10-CM | POA: Diagnosis not present

## 2020-06-07 DIAGNOSIS — E1165 Type 2 diabetes mellitus with hyperglycemia: Secondary | ICD-10-CM

## 2020-06-07 NOTE — Assessment & Plan Note (Addendum)
The nature of diabetic retinopathy was explained using the following analogy: "Retinopathy develops in the body's blood supply like salty water corrodes the linings of pipes in a house, until rust appears, then holes in the pipes develop which leak followed by destruction and loss of the pipes as the corrosion turns them to dust. In a similar fashion, Diabetes damages the blood supply of the body by cumulative long--term elevated blood sugar, which corrodes the blood supply in the body, particularly the blood vessels supplying the retina, kidneys, and nerves".  Thus, control of blood sugar, slows the progression of the corrosive effect of diabetes mellitus.   The nature of proliferative diabetic retinopathy was discussed with the patient as well as the potential for a vitreous hemorrhage and traction on the retina. Tight control of glucose, blood pressure, and serum lipids was recommended in order to slow further progression of disease. Cessation of smoking was recommended. Treatment options including ocular injectable medications, panretinal photocoagulation and/or vitrectomy surgery for advanced disease were discussed. Local medical therapy may slow or arrest progression of disease.  Advanced disease OS, will commence with peripheral panretinal photocoagulation left eye in order to preserve visual functioning long-term.  I explained the patient and wife that hemorrhage can still occur in the left eye because of the current presence of neovascularization which may trigger hemorrhage as the neovascularization undergoes regression (withering)

## 2020-06-07 NOTE — Progress Notes (Signed)
06/07/2020     CHIEF COMPLAINT Patient presents for Diabetic Eye Exam   HISTORY OF PRESENT ILLNESS: Keith Parrish is a 74 y.o. male who presents to the clinic today for:   HPI    Diabetic Eye Exam    Vision is blurred for distance.  Diabetes characteristics include Type 2.  Blood sugar level is controlled.  I, the attending physician,  performed the HPI with the patient and updated documentation appropriately.          Comments    Pt referred by Dr. Hassell Done for RD OD and Diabetic Retinopathy OU  Pt states last October he noticed the lines on his scope were crooked. Pt states he can only see light or dark in OD. Denies any floaters or FOL. BGL: did not check A1C: 9-10       Last edited by Tilda Franco on 06/07/2020  9:06 AM. (History)      Referring physician: Rory Percy, MD [place],  [place] [id number]  HISTORICAL INFORMATION:   Selected notes from the MEDICAL RECORD NUMBER       CURRENT MEDICATIONS: No current outpatient medications on file. (Ophthalmic Drugs)   No current facility-administered medications for this visit. (Ophthalmic Drugs)   Current Outpatient Medications (Other)  Medication Sig  . allopurinol (ZYLOPRIM) 100 MG tablet Take 100 mg by mouth daily.   Marland Kitchen amLODipine (NORVASC) 5 MG tablet Take 1 tablet (5 mg total) by mouth daily.  . carvedilol (COREG) 6.25 MG tablet Take 1 tablet (6.25 mg total) by mouth 2 (two) times daily with a meal.  . furosemide (LASIX) 40 MG tablet Take 1 tablet (40 mg total) by mouth 2 (two) times daily.  . Insulin Glargine (LANTUS SOLOSTAR) 100 UNIT/ML Solostar Pen Inject 30 Units into the skin daily.   . psyllium (METAMUCIL) 58.6 % packet Take 1 packet by mouth daily.   No current facility-administered medications for this visit. (Other)      REVIEW OF SYSTEMS: ROS    Positive for: Endocrine   Last edited by Tilda Franco on 06/07/2020  9:00 AM. (History)       ALLERGIES Allergies  Allergen  Reactions  . Bee Venom Shortness Of Breath and Swelling  . Ace Inhibitors Other (See Comments)    Elevated potassium    PAST MEDICAL HISTORY Past Medical History:  Diagnosis Date  . Diabetes ([place])   . Gout   . Hyperlipidemia   . Hypertension   . Presence of prosthetic heart valve    Past Surgical History:  Procedure Laterality Date  . AORTIC VALVE REPLACEMENT  2011   pig valve  . EYE SURGERY Bilateral 2011   cataract removed from right and left eye  . FOOT SURGERY Left 2006   gout cyst removed    FAMILY HISTORY Family History  Problem Relation Age of Onset  . Other Brother        blood disorder    SOCIAL HISTORY Social History   Tobacco Use  . Smoking status: Former Smoker    Types: Pipe  . Smokeless tobacco: Never Used  Vaping Use  . Vaping Use: Never used  Substance Use Topics  . Alcohol use: Not on file    Comment: OCCASIONAL  . Drug use: Never         OPHTHALMIC EXAM:  Base Eye Exam    Visual Acuity (Snellen - Linear)      Right Left   Dist   HM 20/30 -1       Tonometry (Tonopen, 9:06 AM)      Right Left   Pressure 6 7       Pupils      Pupils Dark Light Shape React APD   Right PERRL 4 3 Round Sluggish +1   Left PERRL 4 3 Round Sluggish None       Visual Fields (Counting fingers)      Left Right    Full    Restrictions  Total superior temporal, inferior temporal, superior nasal, inferior nasal deficiencies       Neuro/Psych    Oriented x3: Yes   Mood/Affect: Normal       Dilation    Both eyes: 1.0% Mydriacyl, 2.5% Phenylephrine @ 9:06 AM        Slit Lamp and Fundus Exam    External Exam      Right Left   External Normal Normal       Slit Lamp Exam      Right Left   Lids/Lashes Normal Normal   Conjunctiva/Sclera White and quiet White and quiet   Cornea Clear Clear   Anterior Chamber Deep and quiet Deep and quiet   Iris Round and reactive Round and reactive   Lens Centered posterior chamber intraocular lens Centered  posterior chamber intraocular lens   Anterior Vitreous Normal Normal       Fundus Exam      Right Left   Posterior Vitreous Membranes, Subhyaloid hemorrhage, Vitreous hemorrhage Normal   Disc no view Neovascularization greater than standard photo 10 C   C/D Ratio  0.3   Macula no view Macular thickening, Mild clinically significant macular edema, Microaneurysms   Vessels PDR-active PDR-active   Periphery Treatment nave periphery, appears attached peripherally red reflex Extensive peripheral nonperfusion, small tufts of NVE nearly 360          IMAGING AND PROCEDURES  Imaging and Procedures for 06/07/20  OCT, Retina - OU - Both Eyes       Right Eye Quality was poor. Progression has no prior data.   Left Eye Quality was good. Scan locations included subfoveal. Progression has no prior data.   Notes OD, no views through dense medial opacity, vitreous hemorrhage   OS, advanced proliferative diabetic retinopathy with large neovascularization of the disc as well as peripheral retinal nonperfusion, however currently with clear media       Color Fundus Photography Optos - OU - Both Eyes       Right Eye Progression has no prior data. Disc findings include neovascularization. Periphery : neovascularization.   Left Eye Progression has no prior data. Disc findings include neovascularization. Macula : microaneurysms, epiretinal membrane, hemorrhage. Vessels : Neovascularization. Periphery : hemorrhage, neovascularization.        B-Scan Ultrasound - OD - Right Eye       Quality was good. Findings included extensive retinal detachment, vitreous opacities, peripheral membrane, traction retinal detachment.        Panretinal Photocoagulation - OS - Left Eye       Time Out Confirmed correct patient, procedure, site, and patient consented.   Anesthesia Topical anesthesia was used. Anesthetic medications included Proparacaine 0.5%.   Laser Information The type of laser  was diode. Color was yellow. The duration in seconds was 0.04. The spot size was 390 microns. Laser power was 260. Total spots was 1434.   Post-op The patient tolerated the procedure well. There were no complications. The patient received written  and verbal post procedure care education.   Notes PRP applied superiorly, and inferiorly.                ASSESSMENT/PLAN:  Proliferative diabetic retinopathy of left eye without macular edema associated with type 2 diabetes mellitus (HCC) The nature of diabetic retinopathy was explained using the following analogy: "Retinopathy develops in the body's blood supply like salty water corrodes the linings of pipes in a house, until rust appears, then holes in the pipes develop which leak followed by destruction and loss of the pipes as the corrosion turns them to dust. In a similar fashion, Diabetes damages the blood supply of the body by cumulative long--term elevated blood sugar, which corrodes the blood supply in the body, particularly the blood vessels supplying the retina, kidneys, and nerves".  Thus, control of blood sugar, slows the progression of the corrosive effect of diabetes mellitus.   The nature of proliferative diabetic retinopathy was discussed with the patient as well as the potential for a vitreous hemorrhage and traction on the retina. Tight control of glucose, blood pressure, and serum lipids was recommended in order to slow further progression of disease. Cessation of smoking was recommended. Treatment options including ocular injectable medications, panretinal photocoagulation and/or vitrectomy surgery for advanced disease were discussed. Local medical therapy may slow or arrest progression of disease.  Advanced disease OS, will commence with peripheral panretinal photocoagulation left eye in order to preserve visual functioning long-term.  I explained the patient and wife that hemorrhage can still occur in the left eye because of  the current presence of neovascularization which may trigger hemorrhage as the neovascularization undergoes regression (withering)  Uncontrolled diabetes mellitus with right eye affected by proliferative retinopathy and traction retinal detachment involving macula, with long-term current use of insulin (HCC) With dense vitreous hemorrhage, subhyaloid hemorrhage as well as likely tractional detachment from massive fibrovascular neovascularization from the optic nerve from PDR.  This I will require surgical intervention in the near future in order to stabilize and recover visual functioning.  Dense medial opacity precludes delivery of PRP preoperatively.  We will schedule injection of intravitreal Avastin followed within 48 hours by surgical intervention the right eye in order to salvage the visual acuity potential in the right eye      ICD-10-CM   1. Uncontrolled diabetes mellitus with right eye affected by proliferative retinopathy and traction retinal detachment involving macula, with long-term current use of insulin (HCC)  E11.3521 OCT, Retina - OU - Both Eyes   E11.65 Color Fundus Photography Optos - OU - Both Eyes   Z79.4 B-Scan Ultrasound - OD - Right Eye  2. Proliferative diabetic retinopathy of left eye without macular edema associated with type 2 diabetes mellitus (HCC)  E11.3592 OCT, Retina - OU - Both Eyes    Color Fundus Photography Optos - OU - Both Eyes    Panretinal Photocoagulation - OS - Left Eye    1.  PRP #1 OU as completed today to prevent progression of retinopathy and best acuity I  2.  Patient will return for treatment to commence OD , in 2 weeks upon his return from [place]  3.  I explained we will deliver intravitreal Avastin OD and commence and proceed to surgical resection of his condition in the right eye to reattach the retina and prevent demise of the eye due to tractional retinal detachment  4.  I explained that post PRP or even without therapy, vitreous  hemorrhage is likely in the left  eye.  Reassured that the patient that vitreous hemorrhage does not cause blinding eye disease.  Post PRP OS today, neovascular disease left eye will begin to come under control  5.  We will schedule for vitrectomy, repair retinal detachment likely silicone oil injection on or around September 15 Ophthalmic Meds Ordered this visit:  No orders of the defined types were placed in this encounter.      Return in about 2 weeks (around 06/21/2020) for Will follow up with injection intravitreal Avastin OD soon.  There are no Patient Instructions on file for this visit.   Explained the diagnoses, plan, and follow up with the patient and they expressed understanding.  Patient expressed understanding of the importance of proper follow up care.   Clent Demark Jacquez Sheetz M.D. Diseases & Surgery of the Retina and Vitreous Retina & Diabetic [place] 06/07/20     Abbreviations: M myopia (nearsighted); A astigmatism; H hyperopia (farsighted); P presbyopia; Mrx spectacle prescription;  CTL contact lenses; OD right eye; OS left eye; OU both eyes  XT exotropia; ET esotropia; PEK punctate epithelial keratitis; PEE punctate epithelial erosions; DES dry eye syndrome; MGD meibomian gland dysfunction; ATs artificial tears; PFAT's preservative free artificial tears; [place] nuclear sclerotic cataract; PSC posterior subcapsular cataract; ERM epi-retinal membrane; PVD posterior vitreous detachment; RD retinal detachment; DM diabetes mellitus; DR diabetic retinopathy; NPDR non-proliferative diabetic retinopathy; PDR proliferative diabetic retinopathy; CSME clinically significant macular edema; DME diabetic macular edema; dbh dot blot hemorrhages; CWS cotton wool spot; POAG primary open angle glaucoma; C/D cup-to-disc ratio; HVF humphrey visual field; GVF goldmann visual field; OCT optical coherence tomography; IOP intraocular pressure; BRVO Branch retinal vein occlusion; CRVO central retinal vein  occlusion; CRAO central retinal artery occlusion; BRAO branch retinal artery occlusion; RT retinal tear; SB scleral buckle; PPV pars plana vitrectomy; VH Vitreous hemorrhage; PRP panretinal laser photocoagulation; IVK intravitreal kenalog; VMT vitreomacular traction; MH Macular hole;  NVD neovascularization of the disc; NVE neovascularization elsewhere; AREDS age related eye disease study; ARMD age related macular degeneration; POAG primary open angle glaucoma; EBMD epithelial/anterior basement membrane dystrophy; ACIOL anterior chamber intraocular lens; IOL intraocular lens; PCIOL posterior chamber intraocular lens; Phaco/IOL phacoemulsification with intraocular lens placement; [place] photorefractive keratectomy; LASIK laser assisted in situ keratomileusis; HTN hypertension; DM diabetes mellitus; COPD chronic obstructive pulmonary disease

## 2020-06-07 NOTE — Assessment & Plan Note (Signed)
With dense vitreous hemorrhage, subhyaloid hemorrhage as well as likely tractional detachment from massive fibrovascular neovascularization from the optic nerve from PDR.  This I will require surgical intervention in the near future in order to stabilize and recover visual functioning.  Dense medial opacity precludes delivery of PRP preoperatively.  We will schedule injection of intravitreal Avastin followed within 48 hours by surgical intervention the right eye in order to salvage the visual acuity potential in the right eye

## 2020-06-13 ENCOUNTER — Telehealth: Payer: Self-pay | Admitting: *Deleted

## 2020-06-13 DIAGNOSIS — E7800 Pure hypercholesterolemia, unspecified: Secondary | ICD-10-CM

## 2020-06-13 NOTE — Telephone Encounter (Signed)
-----   Message from Verta Ellen., NP sent at 06/09/2020  5:07 PM EDT ----- Regarding: Statin medication Can you call this patient and tell him he really needs to be on a lipid-lowering medication. I cannot see any evidence for he has been on a statin medication in the past. If he is willing to start a statin drug please order atorvastatin 20 mg p.o. daily. Then get a FLP and LFT in 6 to 8 weeks after starting the medication. Last year in February his LDL was 153 at his PCP office it looks as though he was never started on a statin. He definitely has the risk factors. If you need in a formal nursing note I can transfer to a staff message. Thanks

## 2020-06-13 NOTE — Telephone Encounter (Signed)
Patient & wife (Deb Rosetti-Toral) notified.  He is willing to start cholesterol medication, but prefers to have more current labs done since last labs done so long ago.  They are currently out of town & will be back on 06/22/2020.  Lab orders mailed to home & he will do at [facility] upon their return home.  Will also hold on starting new medication till new labs have been done.

## 2020-06-28 ENCOUNTER — Other Ambulatory Visit: Payer: Self-pay

## 2020-06-28 ENCOUNTER — Encounter (INDEPENDENT_AMBULATORY_CARE_PROVIDER_SITE_OTHER): Payer: Self-pay | Admitting: Ophthalmology

## 2020-06-28 ENCOUNTER — Ambulatory Visit (INDEPENDENT_AMBULATORY_CARE_PROVIDER_SITE_OTHER): Payer: Medicare Other | Admitting: Ophthalmology

## 2020-06-28 DIAGNOSIS — IMO0002 Reserved for concepts with insufficient information to code with codable children: Secondary | ICD-10-CM

## 2020-06-28 DIAGNOSIS — E113592 Type 2 diabetes mellitus with proliferative diabetic retinopathy without macular edema, left eye: Secondary | ICD-10-CM

## 2020-06-28 DIAGNOSIS — Z794 Long term (current) use of insulin: Secondary | ICD-10-CM | POA: Diagnosis not present

## 2020-06-28 DIAGNOSIS — H4312 Vitreous hemorrhage, left eye: Secondary | ICD-10-CM | POA: Insufficient documentation

## 2020-06-28 DIAGNOSIS — E113521 Type 2 diabetes mellitus with proliferative diabetic retinopathy with traction retinal detachment involving the macula, right eye: Secondary | ICD-10-CM | POA: Diagnosis not present

## 2020-06-28 NOTE — Assessment & Plan Note (Signed)
PRP #1 has been completed, and is now associated with vitreous hemorrhage, preretinal hemorrhage secondary to the regression of the profound neovascular tissue found at primary examination some 3 weeks previous

## 2020-06-28 NOTE — Assessment & Plan Note (Signed)
Patient has combined traction detachment with a advanced PDR, treatment nave, for intravitreal Avastin today and will need surgical intervention within 1 week OD to repair retinal detachment preserve visual functioning and deliver panretinal photocoagulation

## 2020-06-28 NOTE — Progress Notes (Signed)
06/28/2020     CHIEF COMPLAINT Patient presents for Retina Follow Up   HISTORY OF PRESENT ILLNESS: Keith Parrish is a 74 y.o. male who presents to the clinic today for:   HPI    Retina Follow Up    Patient presents with  Diabetic Retinopathy.  In left eye.  This started 3 weeks ago.  Severity is mild.  Duration of 3 weeks.  Since onset it is gradually worsening.          Comments    3 Week F/U OD, Avastin OD  Pt sts he cannot read road signs with OS, and sts VA has gotten worse since laser. Stable VA OD. Pt sts "I can't see out of my left eye." LBS: 158 this AM       Last edited by Keith Parrish, [place] on 06/28/2020  8:39 AM. (History)      Referring physician: Rory Percy, MD [place],  [place] [id number]  HISTORICAL INFORMATION:   Selected notes from the MEDICAL RECORD NUMBER       CURRENT MEDICATIONS: No current outpatient medications on file. (Ophthalmic Drugs)   No current facility-administered medications for this visit. (Ophthalmic Drugs)   Current Outpatient Medications (Other)  Medication Sig  . allopurinol (ZYLOPRIM) 100 MG tablet Take 100 mg by mouth daily.   Marland Kitchen amLODipine (NORVASC) 5 MG tablet Take 1 tablet (5 mg total) by mouth daily.  . carvedilol (COREG) 6.25 MG tablet Take 1 tablet (6.25 mg total) by mouth 2 (two) times daily with a meal.  . furosemide (LASIX) 40 MG tablet Take 1 tablet (40 mg total) by mouth 2 (two) times daily.  . Insulin Glargine (LANTUS SOLOSTAR) 100 UNIT/ML Solostar Pen Inject 30 Units into the skin daily.   . psyllium (METAMUCIL) 58.6 % packet Take 1 packet by mouth daily.   No current facility-administered medications for this visit. (Other)      REVIEW OF SYSTEMS:    ALLERGIES Allergies  Allergen Reactions  . Bee Venom Shortness Of Breath and Swelling  . Ace Inhibitors Other (See Comments)    Elevated potassium    PAST MEDICAL HISTORY Past Medical History:  Diagnosis Date  . Diabetes ([place])   .  Gout   . Hyperlipidemia   . Hypertension   . Presence of prosthetic heart valve    Past Surgical History:  Procedure Laterality Date  . AORTIC VALVE REPLACEMENT  2011   pig valve  . EYE SURGERY Bilateral 2011   cataract removed from right and left eye  . FOOT SURGERY Left 2006   gout cyst removed    FAMILY HISTORY Family History  Problem Relation Age of Onset  . Other Brother        blood disorder    SOCIAL HISTORY Social History   Tobacco Use  . Smoking status: Former Smoker    Types: Pipe  . Smokeless tobacco: Never Used  Vaping Use  . Vaping Use: Never used  Substance Use Topics  . Alcohol use: Not on file    Comment: OCCASIONAL  . Drug use: Never         OPHTHALMIC EXAM: Base Eye Exam    Visual Acuity (ETDRS)      Right Left   Dist [place] CF at 3' 20/50 -2   Dist ph [place] NI NI       Tonometry (Tonopen, 8:36 AM)      Right Left   Pressure 08 12  Pupils      Pupils Dark Light Shape React APD   Right PERRL 4 3 Round Sluggish +1   Left PERRL 4 3 Round Sluggish None       Visual Fields (Counting fingers)      Left Right    Full    Restrictions  Total superior temporal, inferior temporal, superior nasal, inferior nasal deficiencies       Extraocular Movement      Right Left    Full Full       Neuro/Psych    Oriented x3: Yes   Mood/Affect: Normal       Dilation    Both eyes: 1.0% Mydriacyl, 2.5% Phenylephrine @ 8:38 AM  OU per pt        Slit Lamp and Fundus Exam    External Exam      Right Left   External Normal Normal       Slit Lamp Exam      Right Left   Lids/Lashes Normal Normal   Conjunctiva/Sclera White and quiet White and quiet   Cornea Clear Clear   Anterior Chamber Deep and quiet Deep and quiet   Iris Round and reactive Round and reactive   Lens Centered posterior chamber intraocular lens Centered posterior chamber intraocular lens   Anterior Vitreous Normal Normal       Fundus Exam      Right Left   Posterior  Vitreous Membranes, Subhyaloid hemorrhage, Vitreous hemorrhage Normal   Disc no view Neovascularization greater than standard photo 10 C   C/D Ratio  0.3   Macula no view Macular thickening, Mild clinically significant macular edema, Microaneurysms   Vessels PDR-active PDR-active   Periphery Treatment nave periphery, appears attached peripherally red reflex Extensive peripheral nonperfusion, small tufts of NVE nearly 360          IMAGING AND PROCEDURES  Imaging and Procedures for 06/28/20  OCT, Retina - OU - Both Eyes       Right Eye Quality was good. Scan locations included subfoveal. Findings include abnormal foveal contour.   Left Eye Quality was good. Scan locations included subfoveal. Findings include abnormal foveal contour.   Notes Traction retinal detachment of the right eye inferior and nasal to the macula likely from a large neovascular frond from the optic nerve  OS with new onset medial opacity likely vitreous hemorrhage and preretinal hemorrhage seen on the infrared views.  Macula centrally is still attached OS                  ASSESSMENT/PLAN:  Proliferative diabetic retinopathy of left eye without macular edema associated with type 2 diabetes mellitus (HCC) PRP #1 has been completed, and is now associated with vitreous hemorrhage, preretinal hemorrhage secondary to the regression of the profound neovascular tissue found at primary examination some 3 weeks previous  Uncontrolled diabetes mellitus with right eye affected by proliferative retinopathy and traction retinal detachment involving macula, with long-term current use of insulin ([place]) Patient has combined traction detachment with a advanced PDR, treatment nave, for intravitreal Avastin today and will need surgical intervention within 1 week OD to repair retinal detachment preserve visual functioning and deliver panretinal photocoagulation  Vitreous hemorrhage of left eye ([place]) This is new left  eye, secondary to PDR regression post PRP.  I explained to the patient and his wife that [place] truck, going down the mountain side analogy  Head of bed elevated at nighttime OS to keep the visual axis clear  Less will ultimately need vitrectomy and removal of the vitreous hemorrhage once the right eye is stabilized      ICD-10-CM   1. Uncontrolled diabetes mellitus with right eye affected by proliferative retinopathy and traction retinal detachment involving macula, with long-term current use of insulin (HCC)  E11.3521 OCT, Retina - OU - Both Eyes   E11.65    Z79.4   2. Vitreous hemorrhage of left eye (HCC)  H43.12   3. Proliferative diabetic retinopathy of left eye without macular edema associated with type 2 diabetes mellitus ([place])  I78.6767     1.  PRP OD today.  Glimpses of the periphery of the right eye now possible and we will deliver PRP to calm down the peripheral retinopathy.  2.  OD will need intravitreal Avastin, but with high risk of crunch syndrome developing and progression of retinal detachment post intraocular injection, will schedule surgery within 24- 72 hours post injection.  Thus patient return in 4 days for intravitreal Avastin OD  3.  Head  a bed elevated to keep the visual axis of the left eye clear discussed Patient understands left eye will likely need surgical intervention to clear the vitreous hemorrhage, once the right eye is stabilized  Ophthalmic Meds Ordered this visit:  No orders of the defined types were placed in this encounter.      Return in about 4 days (around 07/02/2020) for NO DILATE, COLOR FP, AVASTIN OCT, OD.  There are no Patient Instructions on file for this visit.   Explained the diagnoses, plan, and follow up with the patient and they expressed understanding.  Patient expressed understanding of the importance of proper follow up care.   Clent Demark Lynel Forester M.D. Diseases & Surgery of the Retina and Vitreous Retina & Diabetic [place] 06/28/20     Abbreviations: M myopia (nearsighted); A astigmatism; H hyperopia (farsighted); P presbyopia; Mrx spectacle prescription;  CTL contact lenses; OD right eye; OS left eye; OU both eyes  XT exotropia; ET esotropia; PEK punctate epithelial keratitis; PEE punctate epithelial erosions; DES dry eye syndrome; MGD meibomian gland dysfunction; ATs artificial tears; PFAT's preservative free artificial tears; [place] nuclear sclerotic cataract; PSC posterior subcapsular cataract; ERM epi-retinal membrane; PVD posterior vitreous detachment; RD retinal detachment; DM diabetes mellitus; DR diabetic retinopathy; NPDR non-proliferative diabetic retinopathy; PDR proliferative diabetic retinopathy; CSME clinically significant macular edema; DME diabetic macular edema; dbh dot blot hemorrhages; CWS cotton wool spot; POAG primary open angle glaucoma; C/D cup-to-disc ratio; HVF humphrey visual field; GVF goldmann visual field; OCT optical coherence tomography; IOP intraocular pressure; BRVO Branch retinal vein occlusion; CRVO central retinal vein occlusion; CRAO central retinal artery occlusion; BRAO branch retinal artery occlusion; RT retinal tear; SB scleral buckle; PPV pars plana vitrectomy; VH Vitreous hemorrhage; PRP panretinal laser photocoagulation; IVK intravitreal kenalog; VMT vitreomacular traction; MH Macular hole;  NVD neovascularization of the disc; NVE neovascularization elsewhere; AREDS age related eye disease study; ARMD age related macular degeneration; POAG primary open angle glaucoma; EBMD epithelial/anterior basement membrane dystrophy; ACIOL anterior chamber intraocular lens; IOL intraocular lens; PCIOL posterior chamber intraocular lens; Phaco/IOL phacoemulsification with intraocular lens placement; [place] photorefractive keratectomy; LASIK laser assisted in situ keratomileusis; HTN hypertension; DM diabetes mellitus; COPD chronic obstructive pulmonary disease

## 2020-06-28 NOTE — Assessment & Plan Note (Addendum)
This is new left eye, secondary to PDR regression post PRP.  I explained to the patient and his wife that [place] truck, going down the mountain side analogy  Head of bed elevated at nighttime OS to keep the visual axis clear  Less will ultimately need vitrectomy and removal of the vitreous hemorrhage once the right eye is stabilized

## 2020-06-29 ENCOUNTER — Encounter (INDEPENDENT_AMBULATORY_CARE_PROVIDER_SITE_OTHER): Payer: Medicare Other | Admitting: Ophthalmology

## 2020-07-03 ENCOUNTER — Encounter (INDEPENDENT_AMBULATORY_CARE_PROVIDER_SITE_OTHER): Payer: Self-pay | Admitting: Ophthalmology

## 2020-07-03 ENCOUNTER — Other Ambulatory Visit: Payer: Self-pay

## 2020-07-03 ENCOUNTER — Ambulatory Visit (INDEPENDENT_AMBULATORY_CARE_PROVIDER_SITE_OTHER): Payer: Medicare Other | Admitting: Ophthalmology

## 2020-07-03 ENCOUNTER — Encounter (INDEPENDENT_AMBULATORY_CARE_PROVIDER_SITE_OTHER): Payer: Medicare Other | Admitting: Ophthalmology

## 2020-07-03 ENCOUNTER — Encounter: Payer: Self-pay | Admitting: *Deleted

## 2020-07-03 DIAGNOSIS — E113511 Type 2 diabetes mellitus with proliferative diabetic retinopathy with macular edema, right eye: Secondary | ICD-10-CM

## 2020-07-03 DIAGNOSIS — IMO0002 Reserved for concepts with insufficient information to code with codable children: Secondary | ICD-10-CM

## 2020-07-03 DIAGNOSIS — H4312 Vitreous hemorrhage, left eye: Secondary | ICD-10-CM

## 2020-07-03 MED ORDER — OFLOXACIN 0.3 % OP SOLN: 1.0000 [drp] | OPHTHALMIC | 0 refills | Status: AC

## 2020-07-03 MED ORDER — PREDNISOLONE ACETATE 1 % OP SUSP: 1.0000 [drp] | Freq: Four times a day (QID) | OPHTHALMIC | 0 refills | Status: AC

## 2020-07-03 MED ORDER — BEVACIZUMAB CHEMO INJECTION 1.25MG/0.05ML SYRINGE FOR KALEIDOSCOPE
1.2500 mg | INTRAVITREAL | Status: AC | PRN
  Administered 2020-07-03: 1.25 mg via INTRAVITREAL

## 2020-07-03 NOTE — Patient Instructions (Signed)
Patient instructed that this delivery of Avastin today requires procession to vitrectomy membrane peel within 72 hours.  Is set up for September 22, 3:30 PM at [facility].  At that time, I explained that, surgical intervention right eye may require long-term instillation of silicone oil to flatten the retina after removal of the severe preretinal fibrous tissue explained that another surgery in the future may be required to remove this.  Was given instructions on the use of the topical medications to commence today preoperatively

## 2020-07-03 NOTE — Progress Notes (Signed)
07/03/2020     CHIEF COMPLAINT Patient presents for Retina Follow Up   HISTORY OF PRESENT ILLNESS: Keith Parrish is a 74 y.o. male who presents to the clinic today for:   HPI    Retina Follow Up    Patient presents with  Diabetic Retinopathy.  In right eye.  This started 5 days ago.  Severity is mild.  Duration of 5 days.  Since onset it is stable.          Comments    5 Day Avastin OD, no dilation, FP  Pt denies noticeable changes to [place] OU since last visit. Pt denies ocular pain, flashes of light, or floaters OU.  LBS: 203 this AM       Last edited by [name], [place] on 07/03/2020  8:31 AM. (History)      Referring physician: Rory Percy, MD [place],  [place] [phone]  HISTORICAL INFORMATION:   Selected notes from the MEDICAL RECORD NUMBER       CURRENT MEDICATIONS: Current Outpatient Medications (Ophthalmic Drugs)  Medication Sig  . ofloxacin (OCUFLOX) 0.3 % ophthalmic solution Place 1 drop into the right eye every 4 (four) hours for 21 days.  . prednisoLONE acetate (PRED FORTE) 1 % ophthalmic suspension Place 1 drop into the right eye 4 (four) times daily.   No current facility-administered medications for this visit. (Ophthalmic Drugs)   Current Outpatient Medications (Other)  Medication Sig  . allopurinol (ZYLOPRIM) 100 MG tablet Take 100 mg by mouth daily.   Marland Kitchen amLODipine (NORVASC) 5 MG tablet Take 1 tablet (5 mg total) by mouth daily.  . carvedilol (COREG) 6.25 MG tablet Take 1 tablet (6.25 mg total) by mouth 2 (two) times daily with a meal.  . furosemide (LASIX) 40 MG tablet Take 1 tablet (40 mg total) by mouth 2 (two) times daily.  . Insulin Glargine (LANTUS SOLOSTAR) 100 UNIT/ML Solostar Pen Inject 30 Units into the skin daily.   . psyllium (METAMUCIL) 58.6 % packet Take 1 packet by mouth daily.   No current facility-administered medications for this visit. (Other)      REVIEW OF SYSTEMS:    ALLERGIES Allergies  Allergen  Reactions  . Bee Venom Shortness Of Breath and Swelling  . Ace Inhibitors Other (See Comments)    Elevated potassium    PAST MEDICAL HISTORY Past Medical History:  Diagnosis Date  . Diabetes ([place])   . Gout   . Hyperlipidemia   . Hypertension   . Presence of prosthetic heart valve    Past Surgical History:  Procedure Laterality Date  . AORTIC VALVE REPLACEMENT  2011   pig valve  . EYE SURGERY Bilateral 2011   cataract removed from right and left eye  . FOOT SURGERY Left 2006   gout cyst removed    FAMILY HISTORY Family History  Problem Relation Age of Onset  . Other Brother        blood disorder    SOCIAL HISTORY Social History   Tobacco Use  . Smoking status: Former Smoker    Types: Pipe  . Smokeless tobacco: Never Used  Vaping Use  . Vaping Use: Never used  Substance Use Topics  . Alcohol use: Not on file    Comment: OCCASIONAL  . Drug use: Never         OPHTHALMIC EXAM: Base Eye Exam    Visual Acuity (ETDRS)      Right Left   Dist [place] CF  at 3' 20/60 -2   Dist ph [place] NI NI       Tonometry (Tonopen, 8:33 AM)      Right Left   Pressure 12 13       Pupils      Dark Light Shape React APD   Right 4 3 Round Sluggish +1   Left 4 3 Round Sluggish None       Visual Fields (Counting fingers)      Left Right    Full    Restrictions  Total superior temporal, inferior temporal, superior nasal, inferior nasal deficiencies       Extraocular Movement      Right Left    Full Full       Neuro/Psych    Oriented x3: Yes   Mood/Affect: Normal       Dilation   Defer today per GAR          IMAGING AND PROCEDURES  Imaging and Procedures for 07/03/20  Color Fundus Photography Optos - OU - Both Eyes       Right Eye Progression has been stable. Vessels : Neovascularization. Periphery : neovascularization.   Left Eye Progression has been stable. Vessels : Neovascularization. Periphery : neovascularization.   Notes OD with tractional  retinal detachment with fibrovascular proliferations on the optic nerve  OS, with preretinal hemorrhage, boat-shaped, improving status post recent PRP       Intravitreal Injection, Pharmacologic Agent - OD - Right Eye       Time Out 07/03/2020. 9:18 AM. Confirmed correct patient, procedure, site, and patient consented.   Anesthesia Topical anesthesia was used. Anesthetic medications included [place] 3.5%.   Procedure Preparation included Ofloxacin , Tobramycin 0.3%, 10% betadine to eyelids. A supplied needle was used.   Injection:  1.25 mg Bevacizumab (AVASTIN) SOLN   NDC: [address]-1, Lot: [id number]   Route: Intravitreal, Site: Right Eye, Waste: 0 mg  Post-op Post injection exam found visual acuity of at least counting fingers. The patient tolerated the procedure well. There were no complications. The patient received written and verbal post procedure care education. Post injection medications were not given.                 ASSESSMENT/PLAN:  No problem-specific Assessment & Plan notes found for this encounter.      ICD-10-CM   1. Uncontrolled diabetes mellitus with right eye affected by proliferative retinopathy and traction retinal detachment involving macula, with long-term current use of insulin (HCC)  X64.6803 Color Fundus Photography Optos - OU - Both Eyes   E11.65 Intravitreal Injection, Pharmacologic Agent - OD - Right Eye   Z79.4 Bevacizumab (AVASTIN) SOLN 1.25 mg  2. Diabetic macular edema of right eye with proliferative retinopathy associated with type 2 diabetes mellitus ([place])  E11.3511   3. Vitreous hemorrhage of left eye (HCC)  H43.12     1.  Injection intravitreal Avastin today to control the fibrovascular disease.  This will also in the right eye help to control bleeding at the time of resection of the fibrovascular disease with surgery it within 72 hours  2.  3.  Ophthalmic Meds Ordered this visit:  Meds ordered this encounter  Medications  .  Bevacizumab (AVASTIN) SOLN 1.25 mg  . ofloxacin (OCUFLOX) 0.3 % ophthalmic solution    Sig: Place 1 drop into the right eye every 4 (four) hours for 21 days.    Dispense:  5 mL    Refill:  0  . prednisoLONE acetate (PRED  FORTE) 1 % ophthalmic suspension    Sig: Place 1 drop into the right eye 4 (four) times daily.    Dispense:  10 mL    Refill:  0       Return [facility] right eye, for Schedule vitrectomy, membrane peel, possible silicone oil injection right eye within 72 hours.  There are no Patient Instructions on file for this visit.   Explained the diagnoses, plan, and follow up with the patient and they expressed understanding.  Patient expressed understanding of the importance of proper follow up care.   Clent Demark Tangala Wiegert M.D. Diseases & Surgery of the Retina and Vitreous Retina & Diabetic [place] 07/03/20     Abbreviations: M myopia (nearsighted); A astigmatism; H hyperopia (farsighted); P presbyopia; Mrx spectacle prescription;  CTL contact lenses; OD right eye; OS left eye; OU both eyes  XT exotropia; ET esotropia; PEK punctate epithelial keratitis; PEE punctate epithelial erosions; DES dry eye syndrome; MGD meibomian gland dysfunction; ATs artificial tears; PFAT's preservative free artificial tears; [place] nuclear sclerotic cataract; PSC posterior subcapsular cataract; ERM epi-retinal membrane; PVD posterior vitreous detachment; RD retinal detachment; DM diabetes mellitus; DR diabetic retinopathy; NPDR non-proliferative diabetic retinopathy; PDR proliferative diabetic retinopathy; CSME clinically significant macular edema; DME diabetic macular edema; dbh dot blot hemorrhages; CWS cotton wool spot; POAG primary open angle glaucoma; C/D cup-to-disc ratio; HVF humphrey visual field; GVF goldmann visual field; OCT optical coherence tomography; IOP intraocular pressure; BRVO Branch retinal vein occlusion; CRVO central retinal vein occlusion; CRAO central retinal artery  occlusion; BRAO branch retinal artery occlusion; RT retinal tear; SB scleral buckle; PPV pars plana vitrectomy; VH Vitreous hemorrhage; PRP panretinal laser photocoagulation; IVK intravitreal kenalog; VMT vitreomacular traction; MH Macular hole;  NVD neovascularization of the disc; NVE neovascularization elsewhere; AREDS age related eye disease study; ARMD age related macular degeneration; POAG primary open angle glaucoma; EBMD epithelial/anterior basement membrane dystrophy; ACIOL anterior chamber intraocular lens; IOL intraocular lens; PCIOL posterior chamber intraocular lens; Phaco/IOL phacoemulsification with intraocular lens placement; [place] photorefractive keratectomy; LASIK laser assisted in situ keratomileusis; HTN hypertension; DM diabetes mellitus; COPD chronic obstructive pulmonary disease

## 2020-07-05 ENCOUNTER — Encounter (INDEPENDENT_AMBULATORY_CARE_PROVIDER_SITE_OTHER): Payer: Medicare Other | Admitting: Ophthalmology

## 2020-07-05 DIAGNOSIS — E113521 Type 2 diabetes mellitus with proliferative diabetic retinopathy with traction retinal detachment involving the macula, right eye: Secondary | ICD-10-CM

## 2020-07-06 ENCOUNTER — Encounter (INDEPENDENT_AMBULATORY_CARE_PROVIDER_SITE_OTHER): Payer: Self-pay | Admitting: Ophthalmology

## 2020-07-06 ENCOUNTER — Ambulatory Visit (INDEPENDENT_AMBULATORY_CARE_PROVIDER_SITE_OTHER): Payer: Medicare Other | Admitting: Ophthalmology

## 2020-07-06 ENCOUNTER — Other Ambulatory Visit: Payer: Self-pay

## 2020-07-06 DIAGNOSIS — Z09 Encounter for follow-up examination after completed treatment for conditions other than malignant neoplasm: Secondary | ICD-10-CM

## 2020-07-06 NOTE — Progress Notes (Signed)
07/06/2020     CHIEF COMPLAINT Patient presents for Post-op Follow-up   HISTORY OF PRESENT ILLNESS: Keith Parrish is a 74 y.o. male who presents to the clinic today for:   HPI    Post-op Follow-up    In right eye.  Vision is stable.          Comments    1 Day Post Op OD  Pt reports sleeping well last night. Pt denies ocular pain, nausea, or vomiting.       Last edited by [name], [place] on 07/06/2020  8:08 AM. (History)      Referring physician: Rory Percy, MD [place],  [place] [address]  HISTORICAL INFORMATION:   Selected notes from the MEDICAL RECORD NUMBER       CURRENT MEDICATIONS: Current Outpatient Medications (Ophthalmic Drugs)  Medication Sig  . ofloxacin (OCUFLOX) 0.3 % ophthalmic solution Place 1 drop into the right eye every 4 (four) hours for 21 days.  . prednisoLONE acetate (PRED FORTE) 1 % ophthalmic suspension Place 1 drop into the right eye 4 (four) times daily.   No current facility-administered medications for this visit. (Ophthalmic Drugs)   Current Outpatient Medications (Other)  Medication Sig  . allopurinol (ZYLOPRIM) 100 MG tablet Take 100 mg by mouth daily.   Marland Kitchen amLODipine (NORVASC) 5 MG tablet Take 1 tablet (5 mg total) by mouth daily.  . carvedilol (COREG) 6.25 MG tablet Take 1 tablet (6.25 mg total) by mouth 2 (two) times daily with a meal.  . furosemide (LASIX) 40 MG tablet Take 1 tablet (40 mg total) by mouth 2 (two) times daily.  . Insulin Glargine (LANTUS SOLOSTAR) 100 UNIT/ML Solostar Pen Inject 30 Units into the skin daily.   . psyllium (METAMUCIL) 58.6 % packet Take 1 packet by mouth daily.   No current facility-administered medications for this visit. (Other)      REVIEW OF SYSTEMS:    ALLERGIES Allergies  Allergen Reactions  . Bee Venom Shortness Of Breath and Swelling  . Ace Inhibitors Other (See Comments)    Elevated potassium    PAST MEDICAL HISTORY Past Medical History:  Diagnosis Date  .  Diabetes ([place])   . Gout   . Hyperlipidemia   . Hypertension   . Presence of prosthetic heart valve    Past Surgical History:  Procedure Laterality Date  . AORTIC VALVE REPLACEMENT  2011   pig valve  . EYE SURGERY Bilateral 2011   cataract removed from right and left eye  . FOOT SURGERY Left 2006   gout cyst removed    FAMILY HISTORY Family History  Problem Relation Age of Onset  . Other Brother        blood disorder    SOCIAL HISTORY Social History   Tobacco Use  . Smoking status: Former Smoker    Types: Pipe  . Smokeless tobacco: Never Used  Vaping Use  . Vaping Use: Never used  Substance Use Topics  . Alcohol use: Not on file    Comment: OCCASIONAL  . Drug use: Never         OPHTHALMIC EXAM:  Base Eye Exam    Visual Acuity (ETDRS)      Right Left   Dist [place] CF at 3' 20/40 +1   Dist ph [place] NI NI       Tonometry (Tonopen, 8:09 AM)      Right Left   Pressure 28 13  Tonometry #2 (Tonopen, 8:11 AM)      Right Left   Pressure 27        Pupils      Dark Light Shape React APD   Right 8 8 Round dilated None   Left 4 3 Round Brisk None       Neuro/Psych    Oriented x3: Yes   Mood/Affect: Normal       Dilation    Right eye: 1.0% Mydriacyl, 2.5% Phenylephrine @ 8:11 AM        Slit Lamp and Fundus Exam    External Exam      Right Left   External Normal Normal       Slit Lamp Exam      Right Left   Lids/Lashes Normal Normal   Conjunctiva/Sclera White and quiet White and quiet   Cornea Clear Clear   Anterior Chamber Deep and quiet, well-formed 360 Deep and quiet   Iris Round and reactive Round and reactive   Lens Centered posterior chamber intraocular lens Centered posterior chamber intraocular lens   Anterior Vitreous Normal Normal       Fundus Exam      Right Left   Posterior Vitreous Clear silicone    Disc Obscured by small dot hemorrhage    Macula Attached, good temporal laser    Vessels PDR less active    Periphery Good PRP  360 and a attached 360           IMAGING AND PROCEDURES  Imaging and Procedures for 07/06/20           ASSESSMENT/PLAN:  Postoperative follow-up POST OP RETINAL DETACHMENT REPAIR, USE OF OIL- The operative eye has oil in place.  Do not sleep, rest flat on back for long periods of time, preferably, no longer than 10 minutes.  Sleep or rest on either side.  Travel may include to regions of elevation including mountains, or the coase.  Airline travel is permitted.  Should patient develop difficulty with achy pain Tylenol for discomfort would be appropriate.  Moreover looking face down for 30 to 40 minutes might also relieve any discomfort      ICD-10-CM   1. Postoperative follow-up  Z09     1.  Patient family informed that the underlying traction rhegmatogenous attachment of the macula found beneath the dense fibrovascular tissue at the time of surgical resection, does limit ultimately the visual acuity recovery.  Nonetheless surgical  Reattachment of the retina has been obtained and with the silicone oil in place, globe integrity has been maintained and the potential for visual acuity remains present.  2.  I explained to the patient and family that surgical intervention to remove the silicone oil will not occur before 3 months and more likely 4 to 6 months henceforth.  In the meantime I encourage patient not to sleep or rest on his back.  He is use the topical drops commencing today and use for a total of 3 to 4 weeks or until the current bottle bottles are completed  3.  Ophthalmic Meds Ordered this visit:  No orders of the defined types were placed in this encounter.      Return in about 1 week (around 07/13/2020) for dilate, OD, POST OP, COLOR FP.  There are no Patient Instructions on file for this visit.   Explained the diagnoses, plan, and follow up with the patient and they expressed understanding.  Patient expressed understanding of the importance of proper follow up  care.   Clent Demark. Anne Boltz M.D. Diseases & Surgery of the Retina and Vitreous Retina & Diabetic [place] 07/06/20     Abbreviations: M myopia (nearsighted); A astigmatism; H hyperopia (farsighted); P presbyopia; Mrx spectacle prescription;  CTL contact lenses; OD right eye; OS left eye; OU both eyes  XT exotropia; ET esotropia; PEK punctate epithelial keratitis; PEE punctate epithelial erosions; DES dry eye syndrome; MGD meibomian gland dysfunction; ATs artificial tears; PFAT's preservative free artificial tears; [place] nuclear sclerotic cataract; PSC posterior subcapsular cataract; ERM epi-retinal membrane; PVD posterior vitreous detachment; RD retinal detachment; DM diabetes mellitus; DR diabetic retinopathy; NPDR non-proliferative diabetic retinopathy; PDR proliferative diabetic retinopathy; CSME clinically significant macular edema; DME diabetic macular edema; dbh dot blot hemorrhages; CWS cotton wool spot; POAG primary open angle glaucoma; C/D cup-to-disc ratio; HVF humphrey visual field; GVF goldmann visual field; OCT optical coherence tomography; IOP intraocular pressure; BRVO Branch retinal vein occlusion; CRVO central retinal vein occlusion; CRAO central retinal artery occlusion; BRAO branch retinal artery occlusion; RT retinal tear; SB scleral buckle; PPV pars plana vitrectomy; VH Vitreous hemorrhage; PRP panretinal laser photocoagulation; IVK intravitreal kenalog; VMT vitreomacular traction; MH Macular hole;  NVD neovascularization of the disc; NVE neovascularization elsewhere; AREDS age related eye disease study; ARMD age related macular degeneration; POAG primary open angle glaucoma; EBMD epithelial/anterior basement membrane dystrophy; ACIOL anterior chamber intraocular lens; IOL intraocular lens; PCIOL posterior chamber intraocular lens; Phaco/IOL phacoemulsification with intraocular lens placement; [place] photorefractive keratectomy; LASIK laser assisted in situ keratomileusis; HTN hypertension;  DM diabetes mellitus; COPD chronic obstructive pulmonary disease

## 2020-07-06 NOTE — Assessment & Plan Note (Signed)
POST OP RETINAL DETACHMENT REPAIR, USE OF OIL- The operative eye has oil in place.  Do not sleep, rest flat on back for long periods of time, preferably, no longer than 10 minutes.  Sleep or rest on either side.  Travel may include to regions of elevation including mountains, or the coase.  Airline travel is permitted.  Should patient develop difficulty with achy pain Tylenol for discomfort would be appropriate.  Moreover looking face down for 30 to 40 minutes might also relieve any discomfort

## 2020-07-13 ENCOUNTER — Other Ambulatory Visit: Payer: Self-pay

## 2020-07-13 ENCOUNTER — Ambulatory Visit (INDEPENDENT_AMBULATORY_CARE_PROVIDER_SITE_OTHER): Payer: Medicare Other | Admitting: Ophthalmology

## 2020-07-13 ENCOUNTER — Encounter (INDEPENDENT_AMBULATORY_CARE_PROVIDER_SITE_OTHER): Payer: Self-pay | Admitting: Ophthalmology

## 2020-07-13 DIAGNOSIS — E113592 Type 2 diabetes mellitus with proliferative diabetic retinopathy without macular edema, left eye: Secondary | ICD-10-CM

## 2020-07-13 DIAGNOSIS — E113521 Type 2 diabetes mellitus with proliferative diabetic retinopathy with traction retinal detachment involving the macula, right eye: Secondary | ICD-10-CM

## 2020-07-13 DIAGNOSIS — E1165 Type 2 diabetes mellitus with hyperglycemia: Secondary | ICD-10-CM

## 2020-07-13 DIAGNOSIS — Z9889 Other specified postprocedural states: Secondary | ICD-10-CM

## 2020-07-13 DIAGNOSIS — Z794 Long term (current) use of insulin: Secondary | ICD-10-CM

## 2020-07-13 DIAGNOSIS — IMO0002 Reserved for concepts with insufficient information to code with codable children: Secondary | ICD-10-CM

## 2020-07-13 NOTE — Assessment & Plan Note (Signed)
Retina reattached now, continue topical therapy drops for the next 2 weeks.  Operative positioning reviewed.

## 2020-07-13 NOTE — Assessment & Plan Note (Signed)
Vitreous hemorrhage left eye from active PDR is still continuing to clear.  Will deliver additional PRP temporally and nasally within a week

## 2020-07-13 NOTE — Progress Notes (Signed)
07/13/2020     CHIEF COMPLAINT Patient presents for Post-op Follow-up   HISTORY OF PRESENT ILLNESS: Keith Parrish is a 74 y.o. male who presents to the clinic today for:   HPI    Post-op Follow-up    In right eye.  Vision is improved.          Comments    1 Week Post Op OD  Pt reports objects are brighter OD, and peripheral VA OD has improved. Pt reports occasional sharp pain OD with rapid eye movement. VA OS stable.       Last edited by [name], [place] on 07/13/2020  2:34 PM. (History)      Referring physician: Rory Percy, MD [place],  [place] [address]  HISTORICAL INFORMATION:   Selected notes from the MEDICAL RECORD NUMBER       CURRENT MEDICATIONS: Current Outpatient Medications (Ophthalmic Drugs)  Medication Sig   ofloxacin (OCUFLOX) 0.3 % ophthalmic solution Place 1 drop into the right eye every 4 (four) hours for 21 days.   prednisoLONE acetate (PRED FORTE) 1 % ophthalmic suspension Place 1 drop into the right eye 4 (four) times daily.   No current facility-administered medications for this visit. (Ophthalmic Drugs)   Current Outpatient Medications (Other)  Medication Sig   allopurinol (ZYLOPRIM) 100 MG tablet Take 100 mg by mouth daily.    amLODipine (NORVASC) 5 MG tablet Take 1 tablet (5 mg total) by mouth daily.   carvedilol (COREG) 6.25 MG tablet Take 1 tablet (6.25 mg total) by mouth 2 (two) times daily with a meal.   furosemide (LASIX) 40 MG tablet Take 1 tablet (40 mg total) by mouth 2 (two) times daily.   Insulin Glargine (LANTUS SOLOSTAR) 100 UNIT/ML Solostar Pen Inject 30 Units into the skin daily.    psyllium (METAMUCIL) 58.6 % packet Take 1 packet by mouth daily.   No current facility-administered medications for this visit. (Other)      REVIEW OF SYSTEMS:    ALLERGIES Allergies  Allergen Reactions   Bee Venom Shortness Of Breath and Swelling   Ace Inhibitors Other (See Comments)    Elevated potassium     PAST MEDICAL HISTORY Past Medical History:  Diagnosis Date   Diabetes ([place])    Gout    Hyperlipidemia    Hypertension    Presence of prosthetic heart valve    Past Surgical History:  Procedure Laterality Date   AORTIC VALVE REPLACEMENT  2011   pig valve   EYE SURGERY Bilateral 2011   cataract removed from right and left eye   FOOT SURGERY Left 2006   gout cyst removed    FAMILY HISTORY Family History  Problem Relation Age of Onset   Other Brother        blood disorder    SOCIAL HISTORY Social History   Tobacco Use   Smoking status: Former Smoker    Types: Pipe   Smokeless tobacco: Never Used  Scientific laboratory technician Use: Never used  Substance Use Topics   Alcohol use: Not on file    Comment: OCCASIONAL   Drug use: Never         OPHTHALMIC EXAM: Base Eye Exam    Visual Acuity (ETDRS)      Right Left   Dist [place] CF at 3' 20/60   Dist ph [place] NI        Tonometry (Tonopen, 2:35 PM)      Right Left  Pressure 19 12       Pupils      Pupils Dark Light Shape React APD   Right PERRL 4 3 Round Brisk None   Left PERRL 4 3 Round Brisk None       Visual Fields (Counting fingers)      Left Right    Full    Restrictions  Total superior temporal, inferior temporal, superior nasal, inferior nasal deficiencies       Extraocular Movement      Right Left    Full Full       Neuro/Psych    Oriented x3: Yes   Mood/Affect: Normal       Dilation    Right eye: 1.0% Mydriacyl, 2.5% Phenylephrine @ 2:39 PM        Slit Lamp and Fundus Exam    External Exam      Right Left   External Normal Normal       Slit Lamp Exam      Right Left   Lids/Lashes Normal Normal   Conjunctiva/Sclera White and quiet White and quiet   Cornea Clear Clear   Anterior Chamber Deep and quiet, well-formed 360 Deep and quiet   Iris Round and reactive Round and reactive   Lens Centered posterior chamber intraocular lens Centered posterior chamber intraocular lens    Anterior Vitreous Normal Normal       Fundus Exam      Right Left   Posterior Vitreous Clear silicone    Disc Obscured by small dot hemorrhage    C/D Ratio Preretinal hemorrhage overlying the nerve    Macula Attached, good temporal laser    Vessels PDR less active    Periphery Good PRP 360 and a attached 360           IMAGING AND PROCEDURES  Imaging and Procedures for 07/13/20  Color Fundus Photography Optos - OU - Both Eyes       Right Eye Progression has improved. Disc findings include hemorrhage.   Notes OD macula attached now, clear view.  PRP peripherally.  Small amount of preretinal hemorrhage obscures the nerve in the papillomacular bundle OD.  Peripheral retina and central retina clearly a attached  OS, with diffuse preretinal vitreous hemorrhage from proliferative diabetic retinopathy with good PRP inferiorly and superiorly will need PRP left eye in the near future                ASSESSMENT/PLAN:  Uncontrolled diabetes mellitus with right eye affected by proliferative retinopathy and traction retinal detachment involving macula, with long-term current use of insulin ([place]) Retina reattached now, continue topical therapy drops for the next 2 weeks.  Operative positioning reviewed.  Proliferative diabetic retinopathy of left eye without macular edema associated with type 2 diabetes mellitus (HCC) Vitreous hemorrhage left eye from active PDR is still continuing to clear.  Will deliver additional PRP temporally and nasally within a week      ICD-10-CM   1. Uncontrolled diabetes mellitus with right eye affected by proliferative retinopathy and traction retinal detachment involving macula, with long-term current use of insulin (HCC)  Y69.4854 Color Fundus Photography Optos - OU - Both Eyes   E11.65    Z79.4   2. History of vitrectomy  Z98.890   3. Proliferative diabetic retinopathy of left eye without macular edema associated with type 2 diabetes mellitus  ([place])  O27.0350     1.  He looks great status post complex retinal detachment repair via vitrectomy,  membrane peel, injection silicone oil.  Continue topical therapy for another 2 weeks,  patient understands cannot sleep or lay on his back for the next 3 to 4 months all oil in place  2.  OS, PDR less active yet vitreous hemorrhage slowly clearing, will need completion of PRP soon  3.  Ophthalmic Meds Ordered this visit:  No orders of the defined types were placed in this encounter.      Return in about 1 week (around 07/20/2020) for dilate, OS, PRP.  There are no Patient Instructions on file for this visit.   Explained the diagnoses, plan, and follow up with the patient and they expressed understanding.  Patient expressed understanding of the importance of proper follow up care.   Clent Demark Altair Appenzeller M.D. Diseases & Surgery of the Retina and Vitreous Retina & Diabetic [place] 07/13/20     Abbreviations: M myopia (nearsighted); A astigmatism; H hyperopia (farsighted); P presbyopia; Mrx spectacle prescription;  CTL contact lenses; OD right eye; OS left eye; OU both eyes  XT exotropia; ET esotropia; PEK punctate epithelial keratitis; PEE punctate epithelial erosions; DES dry eye syndrome; MGD meibomian gland dysfunction; ATs artificial tears; PFAT's preservative free artificial tears; [place] nuclear sclerotic cataract; PSC posterior subcapsular cataract; ERM epi-retinal membrane; PVD posterior vitreous detachment; RD retinal detachment; DM diabetes mellitus; DR diabetic retinopathy; NPDR non-proliferative diabetic retinopathy; PDR proliferative diabetic retinopathy; CSME clinically significant macular edema; DME diabetic macular edema; dbh dot blot hemorrhages; CWS cotton wool spot; POAG primary open angle glaucoma; C/D cup-to-disc ratio; HVF humphrey visual field; GVF goldmann visual field; OCT optical coherence tomography; IOP intraocular pressure; BRVO Branch retinal vein occlusion; CRVO  central retinal vein occlusion; CRAO central retinal artery occlusion; BRAO branch retinal artery occlusion; RT retinal tear; SB scleral buckle; PPV pars plana vitrectomy; VH Vitreous hemorrhage; PRP panretinal laser photocoagulation; IVK intravitreal kenalog; VMT vitreomacular traction; MH Macular hole;  NVD neovascularization of the disc; NVE neovascularization elsewhere; AREDS age related eye disease study; ARMD age related macular degeneration; POAG primary open angle glaucoma; EBMD epithelial/anterior basement membrane dystrophy; ACIOL anterior chamber intraocular lens; IOL intraocular lens; PCIOL posterior chamber intraocular lens; Phaco/IOL phacoemulsification with intraocular lens placement; [place] photorefractive keratectomy; LASIK laser assisted in situ keratomileusis; HTN hypertension; DM diabetes mellitus; COPD chronic obstructive pulmonary disease

## 2020-07-18 ENCOUNTER — Encounter (INDEPENDENT_AMBULATORY_CARE_PROVIDER_SITE_OTHER): Payer: Medicare Other | Admitting: Ophthalmology

## 2020-07-19 ENCOUNTER — Telehealth: Payer: Self-pay | Admitting: Family Medicine

## 2020-07-19 MED ORDER — FUROSEMIDE 40 MG PO TABS: 40.0000 mg | ORAL_TABLET | Freq: Two times a day (BID) | ORAL | 1 refills | Status: AC

## 2020-07-19 NOTE — Telephone Encounter (Signed)
Medication sent to pharmacy  

## 2020-07-19 NOTE — Telephone Encounter (Signed)
*  STAT* If patient is at the pharmacy, call can be transferred to refill team.   1. Which medications need to be refilled? (please list name of each medication and dose if known)  furosemide (LASIX) 40 MG tablet [[id number]]   2. Which pharmacy/location (including street and city if local pharmacy) is medication to be sent to?  [place]  3. Do they need a 30 day or 90 day supply?  Pt's wife stated when they picked up the last filled Rx that it said on the bottle no refills

## 2020-07-20 ENCOUNTER — Encounter (INDEPENDENT_AMBULATORY_CARE_PROVIDER_SITE_OTHER): Payer: Self-pay | Admitting: Ophthalmology

## 2020-07-20 ENCOUNTER — Other Ambulatory Visit: Payer: Self-pay

## 2020-07-20 ENCOUNTER — Ambulatory Visit (INDEPENDENT_AMBULATORY_CARE_PROVIDER_SITE_OTHER): Payer: Medicare Other | Admitting: Ophthalmology

## 2020-07-20 DIAGNOSIS — E113592 Type 2 diabetes mellitus with proliferative diabetic retinopathy without macular edema, left eye: Secondary | ICD-10-CM

## 2020-07-20 NOTE — Progress Notes (Signed)
07/20/2020     CHIEF COMPLAINT Patient presents for Retina Follow Up   HISTORY OF PRESENT ILLNESS: Keith Parrish is a 74 y.o. male who presents to the clinic today for:   HPI    Retina Follow Up    Patient presents with  Diabetic Retinopathy.  In left eye.  This started 1 week ago.  Severity is mild.  Duration of 1 week.  Since onset it is stable.          Comments    1 Week PRP OS  Pt reports improving VA OD. Pt denies changes to VA OS. No new symptoms reported OU. LBS: 160 this AM       Last edited by [name], [place] on 07/20/2020  9:05 AM. (History)      Referring physician: Rory Percy, MD [place],  [place] [id number]  HISTORICAL INFORMATION:   Selected notes from the MEDICAL RECORD NUMBER       CURRENT MEDICATIONS: Current Outpatient Medications (Ophthalmic Drugs)  Medication Sig  . ofloxacin (OCUFLOX) 0.3 % ophthalmic solution Place 1 drop into the right eye every 4 (four) hours for 21 days.  . prednisoLONE acetate (PRED FORTE) 1 % ophthalmic suspension Place 1 drop into the right eye 4 (four) times daily.   No current facility-administered medications for this visit. (Ophthalmic Drugs)   Current Outpatient Medications (Other)  Medication Sig  . allopurinol (ZYLOPRIM) 100 MG tablet Take 100 mg by mouth daily.   Marland Kitchen amLODipine (NORVASC) 5 MG tablet Take 1 tablet (5 mg total) by mouth daily.  . carvedilol (COREG) 6.25 MG tablet Take 1 tablet (6.25 mg total) by mouth 2 (two) times daily with a meal.  . furosemide (LASIX) 40 MG tablet Take 1 tablet (40 mg total) by mouth 2 (two) times daily.  . Insulin Glargine (LANTUS SOLOSTAR) 100 UNIT/ML Solostar Pen Inject 30 Units into the skin daily.   . psyllium (METAMUCIL) 58.6 % packet Take 1 packet by mouth daily.   No current facility-administered medications for this visit. (Other)      REVIEW OF SYSTEMS:    ALLERGIES Allergies  Allergen Reactions  . Bee Venom Shortness Of Breath and  Swelling  . Ace Inhibitors Other (See Comments)    Elevated potassium    PAST MEDICAL HISTORY Past Medical History:  Diagnosis Date  . Diabetes ([place])   . Gout   . Hyperlipidemia   . Hypertension   . Presence of prosthetic heart valve    Past Surgical History:  Procedure Laterality Date  . AORTIC VALVE REPLACEMENT  2011   pig valve  . EYE SURGERY Bilateral 2011   cataract removed from right and left eye  . FOOT SURGERY Left 2006   gout cyst removed    FAMILY HISTORY Family History  Problem Relation Age of Onset  . Other Brother        blood disorder    SOCIAL HISTORY Social History   Tobacco Use  . Smoking status: Former Smoker    Types: Pipe  . Smokeless tobacco: Never Used  Vaping Use  . Vaping Use: Never used  Substance Use Topics  . Alcohol use: Not on file    Comment: OCCASIONAL  . Drug use: Never         OPHTHALMIC EXAM: Base Eye Exam    Visual Acuity (ETDRS)      Right Left   Dist [place] CF at 3' 20/400   Dist ph [place]  20/400 NI       Tonometry (Tonopen, 9:06 AM)      Right Left   Pressure 13 10       Pupils      Pupils Dark Light Shape React APD   Right PERRL 4 3 Round Brisk None   Left PERRL 4 3 Round Brisk None       Visual Fields (Counting fingers)      Left Right    Full    Restrictions  Total superior temporal, inferior temporal, superior nasal, inferior nasal deficiencies       Extraocular Movement      Right Left    Full Full       Neuro/Psych    Oriented x3: Yes   Mood/Affect: Normal       Dilation    Left eye: 1.0% Mydriacyl, 2.5% Phenylephrine @ 9:08 AM          IMAGING AND PROCEDURES  Imaging and Procedures for 07/20/20  Panretinal Photocoagulation - OS - Left Eye       Time Out Confirmed correct patient, procedure, site, and patient consented.   Anesthesia Topical anesthesia was used. Anesthetic medications included Proparacaine 0.5%.   Laser Information The type of laser was diode. Color was yellow.  The duration in seconds was 0.03. The spot size was 390 microns. Laser power was 360. Total spots was 1874.   Post-op The patient tolerated the procedure well. There were no complications. The patient received written and verbal post procedure care education.                 ASSESSMENT/PLAN:  Proliferative diabetic retinopathy of left eye without macular edema associated with type 2 diabetes mellitus (HCC) PRP #2 completed today, aggressive fashion with dense vitreous hemorrhage, treated nasally, inferonasally and temporally      ICD-10-CM   1. Proliferative diabetic retinopathy of left eye without macular edema associated with type 2 diabetes mellitus (HCC)  R91.6384 Panretinal Photocoagulation - OS - Left Eye    1.  Dense vitreous hemorrhage left eye, limits delivery of PRP directly inferiorly, further treatment was delivered nasal inferonasal as well as temporally today.  2.  Patient instructed in the use of head of bed elevation to clear the vitreous hemorrhage left eye.  3.  Visual acuity right eye continues to clear after resection in the surgical correction of complex traction rhegmatogenous retinal detachment.  Silicone oil remains clear.  Ophthalmic Meds Ordered this visit:  No orders of the defined types were placed in this encounter.      Return in about 4 weeks (around 08/17/2020) for DILATE OU, COLOR FP.  There are no Patient Instructions on file for this visit.   Explained the diagnoses, plan, and follow up with the patient and they expressed understanding.  Patient expressed understanding of the importance of proper follow up care.   Clent Demark Maxyne Derocher M.D. Diseases & Surgery of the Retina and Vitreous Retina & Diabetic [place] 07/20/20     Abbreviations: M myopia (nearsighted); A astigmatism; H hyperopia (farsighted); P presbyopia; Mrx spectacle prescription;  CTL contact lenses; OD right eye; OS left eye; OU both eyes  XT exotropia; ET esotropia; PEK  punctate epithelial keratitis; PEE punctate epithelial erosions; DES dry eye syndrome; MGD meibomian gland dysfunction; ATs artificial tears; PFAT's preservative free artificial tears; [place] nuclear sclerotic cataract; PSC posterior subcapsular cataract; ERM epi-retinal membrane; PVD posterior vitreous detachment; RD retinal detachment; DM diabetes mellitus; DR diabetic retinopathy; NPDR  non-proliferative diabetic retinopathy; PDR proliferative diabetic retinopathy; CSME clinically significant macular edema; DME diabetic macular edema; dbh dot blot hemorrhages; CWS cotton wool spot; POAG primary open angle glaucoma; C/D cup-to-disc ratio; HVF humphrey visual field; GVF goldmann visual field; OCT optical coherence tomography; IOP intraocular pressure; BRVO Branch retinal vein occlusion; CRVO central retinal vein occlusion; CRAO central retinal artery occlusion; BRAO branch retinal artery occlusion; RT retinal tear; SB scleral buckle; PPV pars plana vitrectomy; VH Vitreous hemorrhage; PRP panretinal laser photocoagulation; IVK intravitreal kenalog; VMT vitreomacular traction; MH Macular hole;  NVD neovascularization of the disc; NVE neovascularization elsewhere; AREDS age related eye disease study; ARMD age related macular degeneration; POAG primary open angle glaucoma; EBMD epithelial/anterior basement membrane dystrophy; ACIOL anterior chamber intraocular lens; IOL intraocular lens; PCIOL posterior chamber intraocular lens; Phaco/IOL phacoemulsification with intraocular lens placement; [place] photorefractive keratectomy; LASIK laser assisted in situ keratomileusis; HTN hypertension; DM diabetes mellitus; COPD chronic obstructive pulmonary disease

## 2020-07-20 NOTE — Assessment & Plan Note (Signed)
PRP #2 completed today, aggressive fashion with dense vitreous hemorrhage, treated nasally, inferonasally and temporally

## 2020-07-28 DIAGNOSIS — Z23 Encounter for immunization: Secondary | ICD-10-CM | POA: Diagnosis not present

## 2020-08-02 NOTE — Telephone Encounter (Signed)
Patient states that he will not be getting labs done here.  They are moving to [place] will get done there when he gets established with provider.

## 2020-08-21 ENCOUNTER — Encounter (INDEPENDENT_AMBULATORY_CARE_PROVIDER_SITE_OTHER): Payer: Self-pay | Admitting: Ophthalmology

## 2020-08-21 ENCOUNTER — Ambulatory Visit (INDEPENDENT_AMBULATORY_CARE_PROVIDER_SITE_OTHER): Payer: Medicare Other | Admitting: Ophthalmology

## 2020-08-21 ENCOUNTER — Other Ambulatory Visit: Payer: Self-pay

## 2020-08-21 DIAGNOSIS — E1165 Type 2 diabetes mellitus with hyperglycemia: Secondary | ICD-10-CM | POA: Diagnosis not present

## 2020-08-21 DIAGNOSIS — Z794 Long term (current) use of insulin: Secondary | ICD-10-CM

## 2020-08-21 DIAGNOSIS — IMO0002 Reserved for concepts with insufficient information to code with codable children: Secondary | ICD-10-CM

## 2020-08-21 DIAGNOSIS — H4312 Vitreous hemorrhage, left eye: Secondary | ICD-10-CM | POA: Diagnosis not present

## 2020-08-21 DIAGNOSIS — E113592 Type 2 diabetes mellitus with proliferative diabetic retinopathy without macular edema, left eye: Secondary | ICD-10-CM | POA: Diagnosis not present

## 2020-08-21 NOTE — Assessment & Plan Note (Signed)
Clearing inferiorly, off the visual axis, option to continue to observe now good PRP.

## 2020-08-21 NOTE — Assessment & Plan Note (Signed)
Neovascularization inferior to the nerve and on the nerve, overall less active but still active.  Good PRP peripherally.  If vitreous hemorrhage continues to worsen, still may need resection of this neovascular tissue OS

## 2020-08-21 NOTE — Progress Notes (Addendum)
08/21/2020     CHIEF COMPLAINT Patient presents for Retina Follow Up   HISTORY OF PRESENT ILLNESS: Keith Parrish is a 74 y.o. male who presents to the clinic today for:   HPI    Retina Follow Up    Patient presents with  Diabetic Retinopathy.  In left eye.  Severity is severe.  Duration of 4 weeks.  Since onset it is stable.  I, the attending physician,  performed the HPI with the patient and updated documentation appropriately.          Comments    4 Week f\u OU. FP  Pt feels vision has improved. Denies any complaints. BGL: 90       Last edited by Tilda Franco on 08/21/2020  9:45 AM. (History)      Referring physician: Rory Percy, MD [place],  [place] [id number]  HISTORICAL INFORMATION:   Selected notes from the MEDICAL RECORD NUMBER       CURRENT MEDICATIONS: No current outpatient medications on file. (Ophthalmic Drugs)   No current facility-administered medications for this visit. (Ophthalmic Drugs)   Current Outpatient Medications (Other)  Medication Sig  . allopurinol (ZYLOPRIM) 100 MG tablet Take 100 mg by mouth daily.   Marland Kitchen amLODipine (NORVASC) 5 MG tablet Take 1 tablet (5 mg total) by mouth daily.  . carvedilol (COREG) 6.25 MG tablet Take 1 tablet (6.25 mg total) by mouth 2 (two) times daily with a meal.  . furosemide (LASIX) 40 MG tablet Take 1 tablet (40 mg total) by mouth 2 (two) times daily.  . Insulin Glargine (LANTUS SOLOSTAR) 100 UNIT/ML Solostar Pen Inject 30 Units into the skin daily.   . psyllium (METAMUCIL) 58.6 % packet Take 1 packet by mouth daily.   No current facility-administered medications for this visit. (Other)      REVIEW OF SYSTEMS: ROS    Positive for: Endocrine   Last edited by Tilda Franco on 08/21/2020  9:45 AM. (History)       ALLERGIES Allergies  Allergen Reactions  . Bee Venom Shortness Of Breath and Swelling  . Ace Inhibitors Other (See Comments)    Elevated potassium    PAST MEDICAL  HISTORY Past Medical History:  Diagnosis Date  . Diabetes ([place])   . Gout   . Hyperlipidemia   . Hypertension   . Presence of prosthetic heart valve    Past Surgical History:  Procedure Laterality Date  . AORTIC VALVE REPLACEMENT  2011   pig valve  . EYE SURGERY Bilateral 2011   cataract removed from right and left eye  . FOOT SURGERY Left 2006   gout cyst removed    FAMILY HISTORY Family History  Problem Relation Age of Onset  . Other Brother        blood disorder    SOCIAL HISTORY Social History   Tobacco Use  . Smoking status: Former Smoker    Types: Pipe  . Smokeless tobacco: Never Used  Vaping Use  . Vaping Use: Never used  Substance Use Topics  . Alcohol use: Not on file    Comment: OCCASIONAL  . Drug use: Never         OPHTHALMIC EXAM:  Base Eye Exam    Visual Acuity (Snellen - Linear)      Right Left   Dist cc CF @ 3' - 20/50 +   Dist ph cc NI 20/40 -2       Tonometry (Tonopen, 9:50 AM)  Right Left   Pressure 12 9       Pupils      Pupils Dark Light Shape React APD   Right PERRL 4 3 Round [place]uggish Trace   Left PERRL 4 3 Round Slow None       Visual Fields (Counting fingers)      Left Right    Full    Restrictions  Total superior temporal, inferior temporal, superior nasal deficiencies  Poor Understanding       Neuro/Psych    Oriented x3: Yes   Mood/Affect: Normal       Dilation    Both eyes: 1.0% Mydriacyl, 2.5% Phenylephrine @ 9:50 AM        Slit Lamp and Fundus Exam    External Exam      Right Left   External Normal Normal       Slit Lamp Exam      Right Left   Lids/Lashes Normal Normal   Conjunctiva/Sclera White and quiet White and quiet   Cornea Clear Clear   Anterior Chamber Deep and quiet, well-formed 360 Deep and quiet   Iris Round and reactive Round and reactive   Lens Centered posterior chamber intraocular lens Centered posterior chamber intraocular lens   Anterior Vitreous Normal Normal        Fundus Exam      Right Left   Posterior Vitreous Clear silicone Preretinal hemorrhage inferior to the macula, off the visual axis now.   Disc Minor fibrous tissue remnant on the nerve OD Neovascularization greater than standard photo 10 C, still appears active inferior to the nerve, will continue to monitor now with complete PRP   C/D Ratio 0.15 0.3   Macula Attached, good temporal laser Macular thickening, Mild clinically significant macular edema, Microaneurysms   Vessels PDR less active PDR-active,,     Periphery Good PRP 360 and  attached 360 Extensive peripheral nonperfusion, small tufts of NVE nearly 360, less active peripheral NVE, status post PRP 360          IMAGING AND PROCEDURES  Imaging and Procedures for 08/21/20  Color Fundus Photography Optos - OU - Both Eyes       Right Eye Progression has improved. Disc findings include neovascularization.   Left Eye Progression has improved. Disc findings include neovascularization.   Notes Good PRP OD, a attached 360, macular reattached after resection of complex macular T RD with rhegmatogenous component   OS, small preretinal hemorrhage from neovascularization of the disc overall stabilizing yet still active.,   good PRP 360                ASSESSMENT/PLAN:  Vitreous hemorrhage of left eye (HCC) Clearing inferiorly, off the visual axis, option to continue to observe now good PRP.   Proliferative diabetic retinopathy of left eye without macular edema associated with type 2 diabetes mellitus ([place]) Neovascularization inferior to the nerve and on the nerve, overall less active but still active.  Good PRP peripherally.  If vitreous hemorrhage continues to worsen, still may need resection of this neovascular tissue OS  Uncontrolled diabetes mellitus with right eye affected by proliferative retinopathy and traction retinal detachment involving macula, with long-term current use of insulin ([place]) This condition now  stabilized and improved status post resection of complex rhegmatogenous retinal detachment tractional involving the macula OD secondary to PDR.  Of silicone oil clear.  Improving vision.      ICD-10-CM   1. Proliferative diabetic retinopathy of left eye without  macular edema associated with type 2 diabetes mellitus (HCC)  B71.6967 Color Fundus Photography Optos - OU - Both Eyes  2. Vitreous hemorrhage of left eye (HCC)  H43.12   3. Uncontrolled diabetes mellitus with right eye affected by proliferative retinopathy and traction retinal detachment involving macula, with long-term current use of insulin ([place])  E93.8101    E11.65    Z79.4     1.  At the end of the encounter today patient reports she is moving back to [place] next week  2.  Patient will need follow-up with local retinal eye care, careful monitoring of vitreous hemorrhage and proliferative diabetic retinopathy progression left eye  3.  Patient understands he must be under the care of a retina surgeon, not a general ophthalmologist so as to monitor the retina of each eye, and potentially undergo vitrectomy and oil removal right eye.  4.  Patient also understands that vitreous hemorrhage in the left eye still may need surgical intervention, currently out of the visual axis able to observe with good PRP 360  Furthermore progressive neovascularization on the nerve may also require surgical intervention in the future.  Ophthalmic Meds Ordered this visit:  No orders of the defined types were placed in this encounter.      Return in about 4 weeks (around 09/18/2020), or Patient reports she will be moving to [place] next week, follow-up will not be here, for COLOR FP, DILATE OU.  There are no Patient Instructions on file for this visit.   Explained the diagnoses, plan, and follow up with the patient and they expressed understanding.  Patient expressed understanding of the importance of proper follow up care.   Clent Demark Obi Scrima  M.D. Diseases & Surgery of the Retina and Vitreous Retina & Diabetic [place] 08/21/20     Abbreviations: M myopia (nearsighted); A astigmatism; H hyperopia (farsighted); P presbyopia; Mrx spectacle prescription;  CTL contact lenses; OD right eye; OS left eye; OU both eyes  XT exotropia; ET esotropia; PEK punctate epithelial keratitis; PEE punctate epithelial erosions; DES dry eye syndrome; MGD meibomian gland dysfunction; ATs artificial tears; PFAT's preservative free artificial tears; [place] nuclear sclerotic cataract; PSC posterior subcapsular cataract; ERM epi-retinal membrane; PVD posterior vitreous detachment; RD retinal detachment; DM diabetes mellitus; DR diabetic retinopathy; NPDR non-proliferative diabetic retinopathy; PDR proliferative diabetic retinopathy; CSME clinically significant macular edema; DME diabetic macular edema; dbh dot blot hemorrhages; CWS cotton wool spot; POAG primary open angle glaucoma; C/D cup-to-disc ratio; HVF humphrey visual field; GVF goldmann visual field; OCT optical coherence tomography; IOP intraocular pressure; BRVO Branch retinal vein occlusion; CRVO central retinal vein occlusion; CRAO central retinal artery occlusion; BRAO branch retinal artery occlusion; RT retinal tear; SB scleral buckle; PPV pars plana vitrectomy; VH Vitreous hemorrhage; PRP panretinal laser photocoagulation; IVK intravitreal kenalog; VMT vitreomacular traction; MH Macular hole;  NVD neovascularization of the disc; NVE neovascularization elsewhere; AREDS age related eye disease study; ARMD age related macular degeneration; POAG primary open angle glaucoma; EBMD epithelial/anterior basement membrane dystrophy; ACIOL anterior chamber intraocular lens; IOL intraocular lens; PCIOL posterior chamber intraocular lens; Phaco/IOL phacoemulsification with intraocular lens placement; [place] photorefractive keratectomy; LASIK laser assisted in situ keratomileusis; HTN hypertension; DM diabetes mellitus; COPD  chronic obstructive pulmonary disease

## 2020-08-21 NOTE — Assessment & Plan Note (Signed)
This condition now stabilized and improved status post resection of complex rhegmatogenous retinal detachment tractional involving the macula OD secondary to PDR.  Of silicone oil clear.  Improving vision.

## 2020-09-22 DIAGNOSIS — E113521 Type 2 diabetes mellitus with proliferative diabetic retinopathy with traction retinal detachment involving the macula, right eye: Secondary | ICD-10-CM | POA: Diagnosis not present

## 2020-09-22 DIAGNOSIS — H4312 Vitreous hemorrhage, left eye: Secondary | ICD-10-CM | POA: Diagnosis not present

## 2020-09-22 DIAGNOSIS — E113512 Type 2 diabetes mellitus with proliferative diabetic retinopathy with macular edema, left eye: Secondary | ICD-10-CM | POA: Diagnosis not present

## 2020-10-19 ENCOUNTER — Encounter (INDEPENDENT_AMBULATORY_CARE_PROVIDER_SITE_OTHER): Payer: Self-pay

## 2020-10-19 DIAGNOSIS — E113521 Type 2 diabetes mellitus with proliferative diabetic retinopathy with traction retinal detachment involving the macula, right eye: Secondary | ICD-10-CM | POA: Diagnosis not present

## 2020-10-19 DIAGNOSIS — H4312 Vitreous hemorrhage, left eye: Secondary | ICD-10-CM | POA: Diagnosis not present

## 2020-10-19 DIAGNOSIS — E113512 Type 2 diabetes mellitus with proliferative diabetic retinopathy with macular edema, left eye: Secondary | ICD-10-CM | POA: Diagnosis not present

## 2020-11-10 DIAGNOSIS — Z01818 Encounter for other preprocedural examination: Secondary | ICD-10-CM | POA: Diagnosis not present

## 2020-11-10 DIAGNOSIS — Z23 Encounter for immunization: Secondary | ICD-10-CM | POA: Diagnosis not present

## 2020-11-10 DIAGNOSIS — I4891 Unspecified atrial fibrillation: Secondary | ICD-10-CM | POA: Diagnosis not present

## 2020-11-10 DIAGNOSIS — E1169 Type 2 diabetes mellitus with other specified complication: Secondary | ICD-10-CM | POA: Diagnosis not present

## 2020-11-10 DIAGNOSIS — N184 Chronic kidney disease, stage 4 (severe): Secondary | ICD-10-CM | POA: Diagnosis not present

## 2020-11-10 DIAGNOSIS — M109 Gout, unspecified: Secondary | ICD-10-CM | POA: Diagnosis not present

## 2020-12-12 DEATH — deceased

## 2020-12-13 ENCOUNTER — Encounter (INDEPENDENT_AMBULATORY_CARE_PROVIDER_SITE_OTHER): Payer: Self-pay
